# Patient Record
Sex: Female | Born: 1955 | Race: Black or African American | Hispanic: No | Marital: Single | Smoking: Current every day smoker
Health system: Southern US, Community
[De-identification: ages and names within clinical notes are randomized; demographics above are authoritative.]

## PROBLEM LIST (undated history)

## (undated) DIAGNOSIS — IMO0001 Reserved for inherently not codable concepts without codable children: Secondary | ICD-10-CM

## (undated) DIAGNOSIS — M1990 Unspecified osteoarthritis, unspecified site: Secondary | ICD-10-CM

## (undated) DIAGNOSIS — I10 Essential (primary) hypertension: Secondary | ICD-10-CM

## (undated) DIAGNOSIS — Z720 Tobacco use: Secondary | ICD-10-CM

## (undated) DIAGNOSIS — K219 Gastro-esophageal reflux disease without esophagitis: Secondary | ICD-10-CM

## (undated) DIAGNOSIS — T7840XA Allergy, unspecified, initial encounter: Secondary | ICD-10-CM

## (undated) DIAGNOSIS — E039 Hypothyroidism, unspecified: Secondary | ICD-10-CM

## (undated) HISTORY — DX: Reserved for inherently not codable concepts without codable children: IMO0001

## (undated) HISTORY — PX: TUBAL LIGATION: SHX77

## (undated) HISTORY — DX: Gastro-esophageal reflux disease without esophagitis: K21.9

## (undated) HISTORY — PX: OTHER SURGICAL HISTORY: SHX169

## (undated) HISTORY — DX: Hypothyroidism, unspecified: E03.9

## (undated) HISTORY — DX: Allergy, unspecified, initial encounter: T78.40XA

## (undated) HISTORY — DX: Tobacco use: Z72.0

---

## 1999-04-11 ENCOUNTER — Encounter: Admission: RE | Admit: 1999-04-11 | Discharge: 1999-04-11 | Payer: Self-pay | Admitting: Obstetrics & Gynecology

## 1999-04-11 ENCOUNTER — Other Ambulatory Visit: Admission: RE | Admit: 1999-04-11 | Discharge: 1999-04-11 | Payer: Self-pay | Admitting: Obstetrics & Gynecology

## 1999-11-24 ENCOUNTER — Other Ambulatory Visit: Admission: RE | Admit: 1999-11-24 | Discharge: 1999-11-24 | Payer: Self-pay | Admitting: Obstetrics and Gynecology

## 2001-08-03 ENCOUNTER — Ambulatory Visit (HOSPITAL_COMMUNITY): Admission: RE | Admit: 2001-08-03 | Discharge: 2001-08-03 | Payer: Self-pay | Admitting: Family Medicine

## 2001-08-03 ENCOUNTER — Encounter: Payer: Self-pay | Admitting: Family Medicine

## 2002-07-31 ENCOUNTER — Emergency Department (HOSPITAL_COMMUNITY): Admission: EM | Admit: 2002-07-31 | Discharge: 2002-07-31 | Payer: Self-pay

## 2002-07-31 ENCOUNTER — Encounter: Payer: Self-pay | Admitting: Internal Medicine

## 2002-07-31 ENCOUNTER — Ambulatory Visit (HOSPITAL_COMMUNITY): Admission: RE | Admit: 2002-07-31 | Discharge: 2002-07-31 | Payer: Self-pay | Admitting: Internal Medicine

## 2002-08-30 ENCOUNTER — Encounter: Payer: Self-pay | Admitting: Internal Medicine

## 2002-08-30 ENCOUNTER — Ambulatory Visit (HOSPITAL_COMMUNITY): Admission: RE | Admit: 2002-08-30 | Discharge: 2002-08-30 | Payer: Self-pay | Admitting: Internal Medicine

## 2003-10-02 ENCOUNTER — Emergency Department (HOSPITAL_COMMUNITY): Admission: EM | Admit: 2003-10-02 | Discharge: 2003-10-02 | Payer: Self-pay | Admitting: *Deleted

## 2003-11-09 ENCOUNTER — Ambulatory Visit (HOSPITAL_COMMUNITY): Admission: RE | Admit: 2003-11-09 | Discharge: 2003-11-09 | Payer: Self-pay | Admitting: General Surgery

## 2005-01-09 ENCOUNTER — Emergency Department (HOSPITAL_COMMUNITY): Admission: EM | Admit: 2005-01-09 | Discharge: 2005-01-10 | Payer: Self-pay | Admitting: Emergency Medicine

## 2005-05-16 ENCOUNTER — Emergency Department (HOSPITAL_COMMUNITY): Admission: EM | Admit: 2005-05-16 | Discharge: 2005-05-16 | Payer: Self-pay | Admitting: *Deleted

## 2006-03-16 HISTORY — PX: LACERATION REPAIR: SHX5168

## 2007-11-09 ENCOUNTER — Emergency Department (HOSPITAL_COMMUNITY): Admission: EM | Admit: 2007-11-09 | Discharge: 2007-11-09 | Payer: Self-pay | Admitting: Family Medicine

## 2008-06-05 ENCOUNTER — Emergency Department (HOSPITAL_COMMUNITY): Admission: EM | Admit: 2008-06-05 | Discharge: 2008-06-06 | Payer: Self-pay | Admitting: Emergency Medicine

## 2009-11-26 ENCOUNTER — Ambulatory Visit (HOSPITAL_COMMUNITY): Admission: RE | Admit: 2009-11-26 | Discharge: 2009-11-26 | Payer: Self-pay | Admitting: Family Medicine

## 2009-11-26 ENCOUNTER — Ambulatory Visit: Payer: Self-pay | Admitting: Internal Medicine

## 2009-12-04 ENCOUNTER — Encounter (INDEPENDENT_AMBULATORY_CARE_PROVIDER_SITE_OTHER): Payer: Self-pay | Admitting: Family Medicine

## 2009-12-04 LAB — CONVERTED CEMR LAB
Albumin: 4 g/dL (ref 3.5–5.2)
Alkaline Phosphatase: 69 units/L (ref 39–117)
BUN: 7 mg/dL (ref 6–23)
Basophils Absolute: 0.1 10*3/uL (ref 0.0–0.1)
Basophils Relative: 1 % (ref 0–1)
CO2: 28 meq/L (ref 19–32)
Calcium: 9.9 mg/dL (ref 8.4–10.5)
Creatinine, Ser: 0.71 mg/dL (ref 0.40–1.20)
Eosinophils Relative: 6 % — ABNORMAL HIGH (ref 0–5)
HCT: 35.8 % — ABNORMAL LOW (ref 36.0–46.0)
Hemoglobin: 12 g/dL (ref 12.0–15.0)
LDL Cholesterol: 96 mg/dL (ref 0–99)
MCHC: 33.5 g/dL (ref 30.0–36.0)
Monocytes Absolute: 0.5 10*3/uL (ref 0.1–1.0)
Monocytes Relative: 7 % (ref 3–12)
RDW: 15 % (ref 11.5–15.5)
Total Protein: 7.1 g/dL (ref 6.0–8.3)
Triglycerides: 57 mg/dL (ref <150)
Vit D, 25-Hydroxy: 42 ng/mL (ref 30–89)

## 2009-12-10 ENCOUNTER — Ambulatory Visit: Payer: Self-pay | Admitting: Family Medicine

## 2009-12-11 ENCOUNTER — Ambulatory Visit (HOSPITAL_COMMUNITY): Admission: RE | Admit: 2009-12-11 | Discharge: 2009-12-11 | Payer: Self-pay | Admitting: Family Medicine

## 2010-08-01 NOTE — Op Note (Signed)
NAME:  Debra [name], Debra [name]                           ACCOUNT NO.:  [id number]   MEDICAL RECORD NO.:  [id number]                   PATIENT TYPE:  AMB   LOCATION:  DAY                                  FACILITY:  [facility]   PHYSICIAN:  Ollen Gross. Vernell Morgans, M.D.              DATE OF BIRTH:  01-Jun-1955   DATE OF PROCEDURE:  11/09/2003  DATE OF DISCHARGE:  11/09/2003                                 OPERATIVE REPORT   PREOPERATIVE DIAGNOSIS:  Left femoral hernia.   POSTOPERATIVE DIAGNOSIS:  Left femoral hernia.   PROCEDURE:  Left femoral hernia repair with mesh.   SURGEON:  Ollen Gross. Carolynne Edouard, M.D.   ANESTHESIA:  General endotracheal.   PROCEDURE:  After informed consent was obtained, the patient was brought to  the operating room and placed in the supine position on the operating table.  After adequate induction of general anesthesia, the patient's left groin and  abdomen were prepped with Betadine and draped in the usual sterile manner.  A small incision was made from the edge of the pubic tubercle on the left  toward the anterior superior iliac spine for a distance of about 5 cm.  This  incision was carried down through the skin and subcutaneous tissue sharply  with the electrocautery.  A small bridging vein was encountered that was  clamped with hemostats, divided, and ligated with 3-0 silk ties.  Dissection  was then carried sharply until the fascia of the external oblique was  encountered.  The dissection was carried along the anterior edge and  inferior edge of the external oblique toward the ilioinguinal ligament.  The  femoral vessels were identified and just medial to these, there was a small  sac that was also identified.  This sac was dissected in a circumferential  manner by a combination of blunt and sharp dissection with the  electrocautery.  The sac would not reduce initially on its own.  The sac was  opened, and there were no visceral contents within the sac.  The sac was  then closed  again with a 2-0 silk suture ligature.  The opening in the  femoral canal beneath the inguinal ligament was then opened by a combination  of some blunt dissection and some sharp dissection of the ilioinguinal  ligament with the electrocautery.  This allowed the femoral hernia to be  reduced without difficulty.  Next a piece of plug-type mesh was used of a  medium size.  The plug-type mesh was grasped with a hemostat and placed into  the defect beneath the inguinal ligament, and the deep layer of the mesh was  then spread out to cover the defect.  The middle tails of the mesh were then  used to anchor the mesh in place.  Prolene 2-0 interrupted stitches were  used to close the space between the ilioinguinal ligament and the pectineal  ligament area, and the tails  of the mesh were incorporated into these  interrupted 2-0 Prolene closures to anchor the mesh in place.  Once this was  accomplished, the repair appeared to be in good position without any  tension.  The wound was then irrigated with copious amounts of saline.  The  subcutaneous fascia was then closed with a running 3-0 Vicryl stitch.  The  wound was infiltrated with 0.25% Marcaine and the wound was closed with a  running 4-0 Monocryl subcuticular stitch.  Benzoin and Steri-Strips and  sterile dressings were applied.  The patient tolerated the procedure well.  At the end of the case all needle, sponge, and instrument counts were  correct.  The patient was then awakened and taken to the recovery room in  stable condition.                                              Ollen Gross. Vernell Morgans, M.D.   PST/MEDQ  D:  11/14/2003  T:  11/14/2003  Job:  [id number]

## 2010-11-26 ENCOUNTER — Other Ambulatory Visit (HOSPITAL_COMMUNITY): Payer: Self-pay | Admitting: Family Medicine

## 2010-11-26 DIAGNOSIS — Z1231 Encounter for screening mammogram for malignant neoplasm of breast: Secondary | ICD-10-CM

## 2010-12-15 ENCOUNTER — Ambulatory Visit (HOSPITAL_COMMUNITY)
Admission: RE | Admit: 2010-12-15 | Discharge: 2010-12-15 | Disposition: A | Discharge status: Home / Self Care | Payer: Self-pay | Source: Ambulatory Visit | Attending: Family Medicine | Admitting: Family Medicine

## 2010-12-15 DIAGNOSIS — Z1231 Encounter for screening mammogram for malignant neoplasm of breast: Secondary | ICD-10-CM | POA: Insufficient documentation

## 2010-12-23 ENCOUNTER — Emergency Department (HOSPITAL_COMMUNITY): Payer: Self-pay

## 2010-12-23 ENCOUNTER — Emergency Department (HOSPITAL_COMMUNITY)
Admission: EM | Admit: 2010-12-23 | Discharge: 2010-12-23 | Disposition: A | Discharge status: Home / Self Care | Payer: Self-pay | Attending: Emergency Medicine | Admitting: Emergency Medicine

## 2010-12-23 DIAGNOSIS — R10816 Epigastric abdominal tenderness: Secondary | ICD-10-CM | POA: Insufficient documentation

## 2010-12-23 DIAGNOSIS — K5900 Constipation, unspecified: Secondary | ICD-10-CM | POA: Insufficient documentation

## 2010-12-23 DIAGNOSIS — I10 Essential (primary) hypertension: Secondary | ICD-10-CM | POA: Insufficient documentation

## 2010-12-23 DIAGNOSIS — R079 Chest pain, unspecified: Secondary | ICD-10-CM | POA: Insufficient documentation

## 2010-12-23 DIAGNOSIS — F17200 Nicotine dependence, unspecified, uncomplicated: Secondary | ICD-10-CM | POA: Insufficient documentation

## 2010-12-23 DIAGNOSIS — R1012 Left upper quadrant pain: Secondary | ICD-10-CM | POA: Insufficient documentation

## 2010-12-23 LAB — COMPREHENSIVE METABOLIC PANEL
ALT: 18 U/L (ref 0–35)
Alkaline Phosphatase: 91 U/L (ref 39–117)
CO2: 27 mEq/L (ref 19–32)
GFR calc Af Amer: >90 mL/min (ref >90)
GFR calc non Af Amer: >90 mL/min (ref >90)
Glucose, Bld: 97 mg/dL (ref 70–99)
Potassium: 3.9 mEq/L (ref 3.5–5.1)
Sodium: 140 mEq/L (ref 135–145)

## 2010-12-23 LAB — URINALYSIS, ROUTINE W REFLEX MICROSCOPIC
Bilirubin Urine: NEGATIVE
Protein, ur: NEGATIVE mg/dL
Specific Gravity, Urine: 1.01 (ref 1.005–1.030)
Urobilinogen, UA: 0.2 mg/dL (ref 0.0–1.0)

## 2010-12-23 LAB — URINE MICROSCOPIC-ADD ON

## 2010-12-23 LAB — DIFFERENTIAL
Basophils Absolute: 0 10*3/uL (ref 0.0–0.1)
Basophils Relative: 1 % (ref 0–1)
Monocytes Relative: 7 % (ref 3–12)
Neutro Abs: 4.7 10*3/uL (ref 1.7–7.7)
Neutrophils Relative %: 59 % (ref 43–77)

## 2010-12-23 LAB — CBC
Hemoglobin: 11.5 g/dL — ABNORMAL LOW (ref 12.0–15.0)
RBC: 3.39 MIL/uL — ABNORMAL LOW (ref 3.87–5.11)

## 2012-07-04 ENCOUNTER — Ambulatory Visit (INDEPENDENT_AMBULATORY_CARE_PROVIDER_SITE_OTHER): Payer: BC Managed Care – PPO | Admitting: Family Medicine

## 2012-07-04 ENCOUNTER — Encounter: Payer: Self-pay | Admitting: *Deleted

## 2012-07-04 ENCOUNTER — Encounter: Payer: Self-pay | Admitting: Family Medicine

## 2012-07-04 VITALS — BP 126/88 | Ht 60.5 in | Wt 176.0 lb

## 2012-07-04 DIAGNOSIS — Z87448 Personal history of other diseases of urinary system: Secondary | ICD-10-CM

## 2012-07-04 DIAGNOSIS — S76019A Strain of muscle, fascia and tendon of unspecified hip, initial encounter: Secondary | ICD-10-CM | POA: Insufficient documentation

## 2012-07-04 DIAGNOSIS — S76012A Strain of muscle, fascia and tendon of left hip, initial encounter: Secondary | ICD-10-CM

## 2012-07-04 DIAGNOSIS — IMO0002 Reserved for concepts with insufficient information to code with codable children: Secondary | ICD-10-CM

## 2012-07-04 DIAGNOSIS — Z7189 Other specified counseling: Secondary | ICD-10-CM | POA: Insufficient documentation

## 2012-07-04 DIAGNOSIS — Z6833 Body mass index (BMI) 33.0-33.9, adult: Secondary | ICD-10-CM

## 2012-07-04 LAB — COMPREHENSIVE METABOLIC PANEL
AST: 24 U/L (ref 0–37)
BUN: 13 mg/dL (ref 6–23)
Calcium: 9.6 mg/dL (ref 8.4–10.5)
Chloride: 104 mEq/L (ref 96–112)
Creat: 0.81 mg/dL (ref 0.50–1.10)
Glucose, Bld: 87 mg/dL (ref 70–99)

## 2012-07-04 LAB — POCT URINALYSIS DIPSTICK
Bilirubin, UA: NEGATIVE
Glucose, UA: NEGATIVE
Leukocytes, UA: NEGATIVE
Nitrite, UA: NEGATIVE

## 2012-07-04 LAB — LIPID PANEL
Cholesterol: 210 mg/dL — ABNORMAL HIGH (ref 0–200)
Triglycerides: 94 mg/dL (ref <150)
VLDL: 19 mg/dL (ref 0–40)

## 2012-07-04 LAB — POCT UA - MICROSCOPIC ONLY

## 2012-07-04 NOTE — Addendum Note (Signed)
Addended by: [place], Adelheid Hoggard on: 07/04/2012 03:04 PM   Modules accepted: Orders

## 2012-07-04 NOTE — Assessment & Plan Note (Signed)
Counseled on quitting and will attempt.

## 2012-07-04 NOTE — Patient Instructions (Addendum)
Thank you for coming in today, and thank you for taking such good care of Debra [name] I think you have a hip flexor strain. Please do the exercises in your handout at least 1-2 times daily for the next 4 weeks Do your exercises as 3 sets of 15.  Please make sure to have a mammogram performed soon I will let you know if anything comes back positive on your urine sample today. Great job on cutting your smoking down to so few per day. With a little more effort I'm sure you can quit completely.

## 2012-07-04 NOTE — Assessment & Plan Note (Signed)
Several lb wt gain over past several years. Pt concerned about this. No known h/o of cholesterol testing or of family HLD. Fasting lipid today CMET today

## 2012-07-04 NOTE — Assessment & Plan Note (Signed)
Concern that this was never fully worked up. No gross hematuria. Will obtain UA today

## 2012-07-04 NOTE — Assessment & Plan Note (Signed)
Hip flexor strain vs hip arthritis vs hernia. Very low suspicioun for symptomatic arthritis or hernia Exercises given and demonstrated NSAIDs F/u in 4 wks

## 2012-07-04 NOTE — Progress Notes (Signed)
Debra [name] is a 57 y.o. female who presents to [facility] today for groin pain  L Groin pain: started 2.5 mo ago. Sudden onset while lifting pt. Intermittent. Comes on when flexing hip. Prevents from movement. Denies any loss of sensation ro strength in LE. Tylenol and pain medications from friend. Able to walk w/o pain.   Tobacco: trying to quit. Only smoking 3 per day. Slowly stopping. Now more ready to quit.   H/o hematuria. [facility]. Non painful. No gross hematuria. Abnormal urine noted >25yr ago. No night sweats, wt loss. Post menopausal.   The following portions of the patient's history were reviewed and updated as appropriate: allergies, current medications, past medical history, family and social history, and problem list.  Patient is a smoker  No past medical history on file.  ROS as above otherwise neg.    Medications reviewed. No current outpatient prescriptions on file.   No current facility-administered medications for this visit.    Exam: BP 126/88  Ht 5' 0.5" (1.537 m)  Wt 176 lb (79.833 kg)  BMI 33.79 kg/m2 Gen: Well NAD HEENT: EOMI,  MMM Lungs: CTABL Nl WOB Heart: RRR no MRG Abd: NT, ND, obese MSK: FROM passively, tender to deep palpation for L medial hip joint. Straight leg raise, FABERs, internal/external rotation nml. Leg length nml.  Exts: Non edematous BL  LE, warm and well perfused.   No results found for this or any previous visit (from the past 72 hour(s)).

## 2012-07-08 ENCOUNTER — Encounter: Payer: Self-pay | Admitting: Family Medicine

## 2012-07-08 DIAGNOSIS — E7800 Pure hypercholesterolemia, unspecified: Secondary | ICD-10-CM | POA: Insufficient documentation

## 2012-07-19 ENCOUNTER — Telehealth: Payer: Self-pay | Admitting: Family Medicine

## 2012-07-19 NOTE — Telephone Encounter (Signed)
Will forward to MD as Lipid panel is slightly abnormal. Greysen Devino, Maryjo Rochester

## 2012-07-19 NOTE — Telephone Encounter (Signed)
Patient is calling for results of her urine test and Dr. Konrad Dolores told her that if she needed something to help her sleep to let him know and she would like something sent to [facility] @ [address].

## 2012-07-20 NOTE — Telephone Encounter (Signed)
Called and spoke to pt by phone regarding results.  Meds ordered Nothing to do

## 2012-08-03 ENCOUNTER — Telehealth: Payer: Self-pay | Admitting: Family Medicine

## 2012-08-03 DIAGNOSIS — G479 Sleep disorder, unspecified: Secondary | ICD-10-CM

## 2012-08-03 DIAGNOSIS — G4700 Insomnia, unspecified: Secondary | ICD-10-CM | POA: Insufficient documentation

## 2012-08-03 NOTE — Telephone Encounter (Signed)
Spoke to pt by phone regarding hip pain and sleep. Pt was told at last appt that we would address this at her f/u as we did not have time during her initial evaluation.   Sleep problems for past year Tries to go to sleep at 11pm nightly Taking friends clonidine PRN Waking up up at 3am Falls asleep aroun 6:30 adn up by 7-730pm  Decaf coffee No caffinated soda or medications such as goody's Drinking water Has tried several otc sleep aids but states they make her feel odd. Has not tried melatonin.   Is doing exercises for hip pain

## 2012-08-03 NOTE — Telephone Encounter (Signed)
Patient is calling back, a little upset because she hasn't heard anything back about her results or the medication and she isn't feeling any better at all.  She was especially upset after hearing that there was a message that Dr. Konrad Dolores had spoken to her about her results and that there were meds ordered.  She said she received no such phone call and no medications and there aren't any meds in her med list.  She said she isn't very impressed, she has only been here for 1 visit.  She would like this clarified and corrected today.  She has to leave for class at 3:30 so she wants to speak to someone before then.

## 2012-08-03 NOTE — Telephone Encounter (Addendum)
Patient states a sleep med was supposed to be Rx'd.  Phone note on 07/19/12 states meds ordered per Dr. Konrad Dolores.  No med Rx'd in Epic.  Will check with Dr. Konrad Dolores and call patient back.  Per patient's request--Ok to leave message on her voice mail if no answer. Gaylene Brooks, RN

## 2012-08-03 NOTE — Assessment & Plan Note (Signed)
Sleep problems for past year Tries to go to sleep at 11pm nightly Taking friends clonidine PRN Waking up up at 3am Falls asleep aroun 6:30 adn up by 7-730pm  Decaf coffee No caffinated soda or medications such as goody's Drinking water Has tried several otc sleep aids but states they make her feel odd. Has not tried melatonin.   - Discussed sleep hygeine and melatonin.  - Not willing to Rx sleep aids such as ambien at this time - No psychiatric hx

## 2012-08-03 NOTE — Telephone Encounter (Signed)
Patient is calling back, a little upset because she hasn't heard anything back about her results or the medication and she isn't feeling any better at all

## 2012-08-03 NOTE — Telephone Encounter (Signed)
Called and left a voicemail w/ pt to call after hours line as I will be on call all night tonight. Need to speak to pt prior to Rx

## 2012-09-30 ENCOUNTER — Encounter: Payer: Self-pay | Admitting: Family Medicine

## 2012-09-30 ENCOUNTER — Ambulatory Visit (INDEPENDENT_AMBULATORY_CARE_PROVIDER_SITE_OTHER): Payer: BC Managed Care – PPO | Admitting: Family Medicine

## 2012-09-30 VITALS — BP 121/78 | HR 80 | Temp 98.1°F | Ht 60.5 in | Wt 172.0 lb

## 2012-09-30 DIAGNOSIS — Z5189 Encounter for other specified aftercare: Secondary | ICD-10-CM

## 2012-09-30 DIAGNOSIS — G4700 Insomnia, unspecified: Secondary | ICD-10-CM

## 2012-09-30 DIAGNOSIS — S76012D Strain of muscle, fascia and tendon of left hip, subsequent encounter: Secondary | ICD-10-CM

## 2012-09-30 DIAGNOSIS — G479 Sleep disorder, unspecified: Secondary | ICD-10-CM

## 2012-09-30 MED ORDER — ZOLPIDEM TARTRATE 5 MG PO TABS: 5.0000 mg | ORAL_TABLET | Freq: Every evening | ORAL | Status: DC | PRN

## 2012-09-30 NOTE — Patient Instructions (Addendum)
Thank you for coming in today You are doing relatively well Please continue taking Ibuprofen 600mg  as needed for pain Continue doing your exercises I will send in a referral for you to be seen by the Sports Medicine doctors Please start taking the Ambien only as needed Stop taking the clonidine Remember to improve your Sleep Hygiene.  Have a blessed day.

## 2012-09-30 NOTE — Progress Notes (Signed)
Debra [name] is a 57 y.o. female who presents to [facility] today for hip pain  Hip pain: doing exercises and NSAIDs as discussed last time w/o benefit. Located in L groin w/o radiation. Pain sharp and primarily w/ hip flexion. Present for 5.5 months. Intermittent. Walks w/o any issues. Unable to stand on one leg and lift the affected leg  Tobacco: 4 a day. Needs the stress relief.   Insomnia: difficulty getting to sleep at night.  melatonin w/o benefit, gave a flutter feeling. Goes to school from 6-9pm Mo -thu. Homework 11-12am. Takes clonidine (from friend) and turns on TV until falls asleep. Wakes up at 4am. Not associated w/ needing to urinate. Takes a nap many afternoons often times for more than 2 hours. Only caffeine is 1 cup coffee in the morning. Has been a problem for years for the pt.    The following portions of the patient's history were reviewed and updated as appropriate: allergies, current medications, past medical history, family and social history, and problem list.  Patient is a smoker  Past Medical History  Diagnosis Date  . Reflux     ROS as above otherwise neg.    Medications reviewed. No current outpatient prescriptions on file.   No current facility-administered medications for this visit.    Exam BP 121/78  Pulse 80  Temp(Src) 98.1 F (36.7 C) (Oral)  Ht 5' 0.5" (1.537 m)  Wt 172 lb (78.019 kg)  BMI 33.03 kg/m2 Gen: Well NAD HEENT: EOMI,  MMM Lungs: CTABL Nl WOB Heart: RRR no MRG Abd: NABS, NT, ND Exts: Non edematous BL  LE, warm and well perfused.  MSK: FROM, L leg TTP along soft tissue of medial thigh, no abnormalities noted on palpation. No pain in hip joint. Pain in medial thigh on flexion of hip both lying down and sitting. No pain on extension, abduction, or adduction of hip/leg. FABERs, internal rotation external rotation of hip nml, Sensation intact,   No results found for this or any previous visit (from the past 72 hour(s)).

## 2012-09-30 NOTE — Assessment & Plan Note (Signed)
Performing exercises and NSAIDs w/o relief Unsure of etiology.  Pt complaining of soft tissue medial thigh pain w/ hip flexion. No actual hip joint pain. Would favor [place] through [facility] team vs other treatment modality as pt w/o relief and no clear etiology Refer to [facility]

## 2012-10-01 NOTE — Assessment & Plan Note (Signed)
Did no tolerate melatonin Spent extensive time discussing sleep hygiene. Ambien PRN. Discussed risks benefits Stop taking friends clonidine WIll not escalate Ambien dose until trying trazodone or hydralazine PRN Would also like pt to attempt CBT Pt not using ETOH for sleep

## 2012-10-12 ENCOUNTER — Ambulatory Visit (INDEPENDENT_AMBULATORY_CARE_PROVIDER_SITE_OTHER): Payer: BC Managed Care – PPO | Admitting: Emergency Medicine

## 2012-10-12 VITALS — BP 144/88 | Ht 64.0 in | Wt 155.0 lb

## 2012-10-12 DIAGNOSIS — M76899 Other specified enthesopathies of unspecified lower limb, excluding foot: Secondary | ICD-10-CM | POA: Insufficient documentation

## 2012-10-12 DIAGNOSIS — M25559 Pain in unspecified hip: Secondary | ICD-10-CM

## 2012-10-12 DIAGNOSIS — M25552 Pain in left hip: Secondary | ICD-10-CM

## 2012-10-12 MED ORDER — MELOXICAM 15 MG PO TABS: 15.0000 mg | ORAL_TABLET | Freq: Every day | ORAL | Status: DC

## 2012-10-12 NOTE — Progress Notes (Signed)
  Subjective    Patient ID: Debra [name], female    DOB: Jul 16, 1955, 57 y.o.   MRN: [id number]  HPI Debra [name] is a 57 year old woman who presents to the sports medicine clinic office today with a complaint of left hip pain she reports the onset of symptoms 6 months to one year ago. pain is worse when reaching down to put on socks in the morning or reaching down to tie her shoes. She reports the pain with lifting her leg up on occasion as well. She describes the pain as originating in her groin and radiating down the medial aspect of her thigh supramid thigh. She got mild relief with Motrin but has since stopped the taking it due to the size of dose needed to achieve comfort her pain relief. She rates the pain at worst a 6/10.  She denies any significant injury and does not recall a specific instance that which the pain started. 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  Past Medical History  Diagnosis Date  . Reflux    Past Surgical History  Procedure Laterality Date  . Tubal ligation Bilateral   . Laceration repair      severe laceration requiring OR repair and PT     Review of Systems  review of systems as as per history of present illness, otherwise negative.   Objective:   Physical Exam  this is a 57 year old woman in no acute distress alert and oriented  Hip: ROM IR: 45 Deg, ER: 45 Deg, Flexion: 120 Deg, Extension: 30 Abduction: 45 Deg, Adduction: 45 Deg Strength IR: 5/5, ER: 5/5, Flexion: 5/5, Extension: 5/5, Abduction: 5/5, Adduction: 5/5 Pelvic alignment unremarkable to inspection and palpation. Standing hip rotation and gait without trendelenburg / unsteadiness. Greater trochanter without tenderness to palpation. No tenderness over piriformis and greater trochanter. No SI joint tenderness and normal minimal SI movement. Negative log roll. She does report symptoms when  combining flexion with external rotation.  Neurovascularly intact.  No antalgic gait.

## 2012-10-12 NOTE — Patient Instructions (Signed)
Hip Pain The hips join the upper legs to the lower pelvis. The bones, cartilage, tendons, and muscles of the hip joint perform a lot of work each day holding your body weight and allowing you to move around. Hip pain is a common symptom. It can range from a minor ache to severe pain on 1 or both hips. Pain may be felt on the inside of the hip joint near the groin, or the outside near the buttocks and upper thigh. There may be swelling or stiffness as well. It occurs more often when a person walks or performs activity. There are many reasons hip pain can develop. CAUSES  It is important to work with your caregiver to identify the cause since many conditions can impact the bones, cartilage, muscles, and tendons of the hips. Causes for hip pain include:  Broken (fractured) bones.  Separation of the thighbone from the hip socket (dislocation).  Torn cartilage of the hip joint.  Swelling (inflammation) of a tendon (tendonitis), the sac within the hip joint (bursitis), or a joint.  A weakening in the abdominal wall (hernia), affecting the nerves to the hip.  Arthritis in the hip joint or lining of the hip joint.  Pinched nerves in the back, hip, or upper thigh.  A bulging disc in the spine (herniated disc).  Rarely, bone infection or cancer. DIAGNOSIS  The location of your hip pain will help your caregiver understand what may be causing the pain. A diagnosis is based on your medical history, your symptoms, results from your physical exam, and results from diagnostic tests. Diagnostic tests may include X-ray exams, a computerized magnetic scan (magnetic resonance imaging, MRI), or bone scan. TREATMENT  Treatment will depend on the cause of your hip pain. Treatment may include:  Limiting activities and resting until symptoms improve.  Crutches or other walking supports (a cane or brace).  Ice, elevation, and compression.  Physical therapy or home exercises.  Shoe inserts or special  shoes.  Losing weight.  Medications to reduce pain.  Undergoing surgery. HOME CARE INSTRUCTIONS   Only take over-the-counter or prescription medicines for pain, discomfort, or fever as directed by your caregiver.  Put ice on the injured area:  Put ice in a plastic bag.  Place a towel between your skin and the bag.  Leave the ice on for 15-20 minutes at a time, 3-4 times a day.  Keep your leg raised (elevated) when possible to lessen swelling.  Avoid activities that cause pain.  Follow specific exercises as directed by your caregiver.  Sleep with a pillow between your legs on your most comfortable side.  Record how often you have hip pain, the location of the pain, and what it feels like. This information may be helpful to you and your caregiver.  Ask your caregiver about returning to work or sports and whether you should drive.  Follow up with your caregiver for further exams, therapy, or testing as directed. SEEK MEDICAL CARE IF:   Your pain or swelling continues or worsens after 1 week.  You are feeling unwell or have chills.  You have increasing difficulty with walking.  You have a loss of sensation or other new symptoms.  You have questions or concerns. SEEK IMMEDIATE MEDICAL CARE IF:   You cannot put weight on the affected hip.  You have fallen.  You have a sudden increase in pain and swelling in your hip.  You have a fever. MAKE SURE YOU:   Understand these instructions.  Will watch your condition.  Will get help right away if you are not doing well or get worse. Document Released: 08/20/2009 Document Revised: 05/25/2011 Document Reviewed: 08/20/2009 [facility] Patient Information 2014 [place], [place].  Follow up after your xrays are performed next week.

## 2012-10-14 ENCOUNTER — Other Ambulatory Visit: Payer: Self-pay | Admitting: Emergency Medicine

## 2012-10-14 ENCOUNTER — Ambulatory Visit (HOSPITAL_COMMUNITY)
Admission: RE | Admit: 2012-10-14 | Discharge: 2012-10-14 | Disposition: A | Discharge status: Home / Self Care | Payer: BC Managed Care – PPO | Source: Ambulatory Visit | Attending: Emergency Medicine | Admitting: Emergency Medicine

## 2012-10-14 DIAGNOSIS — M76899 Other specified enthesopathies of unspecified lower limb, excluding foot: Secondary | ICD-10-CM | POA: Insufficient documentation

## 2012-10-14 DIAGNOSIS — M25552 Pain in left hip: Secondary | ICD-10-CM

## 2012-10-14 DIAGNOSIS — M25559 Pain in unspecified hip: Secondary | ICD-10-CM | POA: Insufficient documentation

## 2012-10-21 ENCOUNTER — Encounter: Payer: Self-pay | Admitting: Sports Medicine

## 2012-10-21 ENCOUNTER — Ambulatory Visit (INDEPENDENT_AMBULATORY_CARE_PROVIDER_SITE_OTHER): Payer: BC Managed Care – PPO | Admitting: Sports Medicine

## 2012-10-21 VITALS — BP 112/77 | HR 86 | Ht 64.0 in | Wt 155.0 lb

## 2012-10-21 DIAGNOSIS — M25559 Pain in unspecified hip: Secondary | ICD-10-CM

## 2012-10-21 DIAGNOSIS — M25552 Pain in left hip: Secondary | ICD-10-CM

## 2012-10-21 NOTE — Progress Notes (Signed)
  Subjective:    Patient ID: Debra [name], female    DOB: 02-03-1956, 57 y.o.   MRN: [id number]  HPI Is a 57 year old female who presents to the sports medicine clinic for followup after her x-rays were done for evaluation of left hip pain. She has a history of falls one year of intermittent episodes of left hip pain the pain is medial in the proximal adductor region of her thigh.  Since her last visit she has been taking meloxicam with mild relief of her symptoms. She has no new complaints today   Review of Systems Review of systems as per history of present illness otherwise negative    Objective:   Physical Exam BP 112/77  Pulse 86  Ht 5\' 4"  (1.626 m)  Wt 155 lb (70.308 kg)  BMI 26.59 kg/m2 This is a well-appearing pleasant 57 year old female awake alert in no acute distress  Hip: ROM IR: 30  Deg, ER: 30 Deg, Flexion: 120 Deg, Extension: 100 Deg, Abduction: 45 Deg, Adduction: 30 Deg Strength IR: 5/5, ER: 5/5, Flexion: 5/5, Extension: 5/5, Abduction: 5/5, Adduction: 5/5 Pelvic alignment unremarkable to inspection and palpation. Standing hip rotation and gait without trendelenburg / unsteadiness. Greater trochanter without tenderness to palpation. No tenderness over piriformis and greater trochanter. No SI joint tenderness and normal minimal SI movement. Positive FABER  X-rays of her left hip are reviewed and are normal with no evidence of osteoarthritis or degenerative joint disease

## 2012-10-21 NOTE — Assessment & Plan Note (Signed)
X-ray results were reviewed with the patient today. She is pleased with the negative results and has been taking the Mobic as needed. History and findings consistent with adductor muscle strain versus tendinitis. Patient declined physical therapy. She will continue when necessary NSAIDs Hip strengthening exercises were given to her to perform at home. 2 we'll followup as needed

## 2013-04-19 ENCOUNTER — Encounter: Payer: Self-pay | Admitting: Family Medicine

## 2013-04-19 ENCOUNTER — Ambulatory Visit (INDEPENDENT_AMBULATORY_CARE_PROVIDER_SITE_OTHER): Payer: BC Managed Care – PPO | Admitting: Family Medicine

## 2013-04-19 VITALS — BP 148/85 | HR 64 | Temp 98.2°F | Ht 64.0 in | Wt 173.0 lb

## 2013-04-19 DIAGNOSIS — R142 Eructation: Secondary | ICD-10-CM

## 2013-04-19 DIAGNOSIS — R143 Flatulence: Secondary | ICD-10-CM

## 2013-04-19 DIAGNOSIS — R141 Gas pain: Secondary | ICD-10-CM

## 2013-04-19 MED ORDER — SENNA 8.6 MG PO TABS: 2.0000 | ORAL_TABLET | Freq: Two times a day (BID) | ORAL | Status: DC

## 2013-04-19 MED ORDER — GLYCERIN (LAXATIVE) 2 G RE SUPP: 1.0000 | Freq: Once | RECTAL | Status: DC

## 2013-04-19 NOTE — Progress Notes (Signed)
Debra [name] is a 58 y.o. female who [facility]  Flatulance: increased since November. No change. Breaking wind about 3x daily. Pt main concern is that she is unable to control the flatulance and will break wind in public. Denies any medication changes, fevers, dysuria, abd pain. Takes stool softeners and has 2-3x wkly. Increase in sugar, coffee, and tea intake during this time. Very little fried food on an wkly basis. No loss of bowel or bladder function  The following portions of the patient's history were reviewed and updated as appropriate: allergies, current medications, past medical history, family and social history, and problem list.   Past Medical History  Diagnosis Date  . Reflux     ROS as above otherwise neg.    Medications reviewed. Current Outpatient Prescriptions  Medication Sig Dispense Refill  . glycerin adult 2 G SUPP Place 1 suppository rectally once.  10 each  prn  . meloxicam (MOBIC) 15 MG tablet Take 1 tablet (15 mg total) by mouth daily.  30 tablet  1  . senna (SENOKOT) 8.6 MG TABS tablet Take 2 tablets (17.2 mg total) by mouth 2 (two) times daily.  30 each  prn  . zolpidem (AMBIEN) 5 MG tablet Take 1 tablet (5 mg total) by mouth at bedtime as needed for sleep.  30 tablet  3   No current facility-administered medications for this visit.    Exam:  BP 148/85  Pulse 64  Temp(Src) 98.2 F (36.8 C) (Oral)  Ht 5\' 4"  (1.626 m)  Wt 173 lb (78.472 kg)  BMI 29.68 kg/m2 Gen: Well NAD HEENT: EOMI,  MMM Lungs: CTABL Nl WOB Heart: RRR no MRG Abd: NABS, NT, ND Exts: Non edematous BL  LE, warm and well perfused.   No results found for this or any previous visit (from the past 72 hour(s)).  A/P (as seen in Problem list)  Flatulence Likely from constipation, and diet, w/ some possible sphincter laxity though not to the point of causing fecal incontinence. Increase to max dose of Gas-X Start Miralax BID adn Senna BID Dietary changes  reviewed Recommended Kegal exercises

## 2013-04-19 NOTE — Assessment & Plan Note (Addendum)
Likely from constipation, and diet, w/ some possible sphincter laxity though not to the point of causing fecal incontinence. Increase to max dose of Gas-X Start Miralax BID adn Senna BID Dietary changes reviewed Recommended Kegal exercises Also consider starting a probiotic

## 2013-04-19 NOTE — Patient Instructions (Signed)
THank you for coming in today You are doing well overall but I think the majority of your symptoms are from constipation and a weak anal sphincter Please start miralax (2 packs twice a day), and Senokot 2 tabs twice a day and the glycerine suppositories as needed until you have daily soft bowel movements Please increase the amount of fiber adn fluid in your diet Please start performing Kegel exercises Come back if this does not help

## 2013-08-10 ENCOUNTER — Other Ambulatory Visit (HOSPITAL_COMMUNITY)
Admission: RE | Admit: 2013-08-10 | Discharge: 2013-08-10 | Disposition: A | Discharge status: Home / Self Care | Payer: No Typology Code available for payment source | Source: Ambulatory Visit | Attending: Family Medicine | Admitting: Family Medicine

## 2013-08-10 ENCOUNTER — Encounter: Payer: Self-pay | Admitting: Family Medicine

## 2013-08-10 ENCOUNTER — Ambulatory Visit (INDEPENDENT_AMBULATORY_CARE_PROVIDER_SITE_OTHER): Payer: No Typology Code available for payment source | Admitting: Family Medicine

## 2013-08-10 VITALS — BP 118/72 | HR 76 | Temp 98.1°F | Wt 163.0 lb

## 2013-08-10 DIAGNOSIS — G4700 Insomnia, unspecified: Secondary | ICD-10-CM

## 2013-08-10 DIAGNOSIS — F32A Depression, unspecified: Secondary | ICD-10-CM | POA: Insufficient documentation

## 2013-08-10 DIAGNOSIS — Z1151 Encounter for screening for human papillomavirus (HPV): Secondary | ICD-10-CM | POA: Insufficient documentation

## 2013-08-10 DIAGNOSIS — F329 Major depressive disorder, single episode, unspecified: Secondary | ICD-10-CM

## 2013-08-10 DIAGNOSIS — E7800 Pure hypercholesterolemia, unspecified: Secondary | ICD-10-CM

## 2013-08-10 DIAGNOSIS — Z0000 Encounter for general adult medical examination without abnormal findings: Secondary | ICD-10-CM

## 2013-08-10 DIAGNOSIS — F3289 Other specified depressive episodes: Secondary | ICD-10-CM

## 2013-08-10 DIAGNOSIS — R141 Gas pain: Secondary | ICD-10-CM

## 2013-08-10 DIAGNOSIS — R143 Flatulence: Secondary | ICD-10-CM

## 2013-08-10 DIAGNOSIS — Z01419 Encounter for gynecological examination (general) (routine) without abnormal findings: Secondary | ICD-10-CM | POA: Insufficient documentation

## 2013-08-10 DIAGNOSIS — R142 Eructation: Secondary | ICD-10-CM

## 2013-08-10 DIAGNOSIS — Z124 Encounter for screening for malignant neoplasm of cervix: Secondary | ICD-10-CM

## 2013-08-10 LAB — COMPREHENSIVE METABOLIC PANEL
ALBUMIN: 4.4 g/dL (ref 3.5–5.2)
ALK PHOS: 91 U/L (ref 39–117)
ALT: 25 U/L (ref 0–35)
AST: 22 U/L (ref 0–37)
BUN: 13 mg/dL (ref 6–23)
CALCIUM: 9.3 mg/dL (ref 8.4–10.5)
CHLORIDE: 105 meq/L (ref 96–112)
CO2: 27 meq/L (ref 19–32)
Creat: 0.76 mg/dL (ref 0.50–1.10)
GLUCOSE: 96 mg/dL (ref 70–99)
POTASSIUM: 4.1 meq/L (ref 3.5–5.3)
SODIUM: 139 meq/L (ref 135–145)
TOTAL PROTEIN: 7.6 g/dL (ref 6.0–8.3)
Total Bilirubin: 0.4 mg/dL (ref 0.2–1.2)

## 2013-08-10 LAB — LIPID PANEL
CHOLESTEROL: 200 mg/dL (ref 0–200)
HDL: 46 mg/dL (ref >39)
LDL Cholesterol: 139 mg/dL — ABNORMAL HIGH (ref 0–99)
Total CHOL/HDL Ratio: 4.3 Ratio
Triglycerides: 75 mg/dL (ref <150)
VLDL: 15 mg/dL (ref 0–40)

## 2013-08-10 LAB — CBC
HCT: 34.9 % — ABNORMAL LOW (ref 36.0–46.0)
Hemoglobin: 12.1 g/dL (ref 12.0–15.0)
MCH: 32.5 pg (ref 26.0–34.0)
MCHC: 34.7 g/dL (ref 30.0–36.0)
MCV: 93.8 fL (ref 78.0–100.0)
PLATELETS: 358 10*3/uL (ref 150–400)
RBC: 3.72 MIL/uL — AB (ref 3.87–5.11)
RDW: 14.6 % (ref 11.5–15.5)
WBC: 8.1 10*3/uL (ref 4.0–10.5)

## 2013-08-10 LAB — TSH: TSH: 5.587 u[IU]/mL — AB (ref 0.350–4.500)

## 2013-08-10 LAB — VITAMIN B12

## 2013-08-10 LAB — POCT GLYCOSYLATED HEMOGLOBIN (HGB A1C): HEMOGLOBIN A1C: 5.6

## 2013-08-10 MED ORDER — TRAZODONE HCL 50 MG PO TABS: 25.0000 mg | ORAL_TABLET | Freq: Every evening | ORAL | Status: DC | PRN

## 2013-08-10 NOTE — Progress Notes (Signed)
Debra [name] is a 58 y.o. female who presents to [facility] today for CPE  Pap today  Sleep: continues to have sleeping difficulty gettign to sleep or waking at night. Denies any vivid dreams   Depression: ongoing for 2 years gradual worsening. No close family or friends for support. Church helps but only goes once a week. Denies any HI/SI, eating disturbance, difficulty enjoying daily activities. No one particular event that worsened symptoms. Not interested in using medications but interested in counseling.    The following portions of the patient's history were reviewed and updated as appropriate: allergies, current medications, past medical history, family and social history, and problem list.  Patient is a nonsmoker.  Health Maintenance: PAP today, scheduling colonoscopy  Past Medical History  Diagnosis Date  . Reflux     ROS as above otherwise neg.    Medications reviewed. Current Outpatient Prescriptions  Medication Sig Dispense Refill  . glycerin adult 2 G SUPP Place 1 suppository rectally once.  10 each  prn  . meloxicam (MOBIC) 15 MG tablet Take 1 tablet (15 mg total) by mouth daily.  30 tablet  1  . senna (SENOKOT) 8.6 MG TABS tablet Take 2 tablets (17.2 mg total) by mouth 2 (two) times daily.  30 each  prn  . zolpidem (AMBIEN) 5 MG tablet Take 1 tablet (5 mg total) by mouth at bedtime as needed for sleep.  30 tablet  3   No current facility-administered medications for this visit.    Exam: BP 118/72  Pulse 76  Temp(Src) 98.1 F (36.7 C)  Wt 163 lb (73.936 kg) Gen: Well NAD HEENT: EOMI,  MMM Lungs: CTABL Nl WOB Heart: RRR no MRG Abd: NABS, NT, ND GU: vaginal walls well rugated. Cervix nml. No lesions or tenderness Exts: Non edematous BL  LE, warm and well perfused.   No results found for this or any previous visit (from the past 72 hour(s)).  A/P (as seen in Problem list)  No problem-specific assessment & plan notes found for this encounter.   Spent >25 min in  direct pt care and counseling

## 2013-08-10 NOTE — Assessment & Plan Note (Signed)
Resolved w/ cutting back on processed sugar adn bowel regimen. No longer taking bowel prep

## 2013-08-10 NOTE — Assessment & Plan Note (Signed)
Discussed sleep hygeine Start trazodone

## 2013-08-10 NOTE — Assessment & Plan Note (Signed)
Pt to call for colonoscopy Start ASA CMET, Lipids, CBC,  A1c

## 2013-08-10 NOTE — Assessment & Plan Note (Signed)
PHQ9: 13 Pt would like to start counseling TSH today, VIT D, B12, Pt to go to either [facility] or [facility] for counseling

## 2013-08-10 NOTE — Patient Instructions (Addendum)
You are doing well overall Please start the trazodone for the sleep adn remember sleep hygeine Please follow up with gettign yourself scheduled for your colonoscopy Please start a daliy baby aspirin Please come back in 4 weeks Please call and set up an appointment with either [facility] or [facility] to discuss counseling.    [facility]: [phone] [facility] Psychology: [phone]

## 2013-08-10 NOTE — Assessment & Plan Note (Signed)
CMET lipid panel today

## 2013-08-11 ENCOUNTER — Encounter: Payer: Self-pay | Admitting: Internal Medicine

## 2013-08-11 LAB — VITAMIN D 25 HYDROXY (VIT D DEFICIENCY, FRACTURES): VIT D 25 HYDROXY: 64 ng/mL (ref 30–89)

## 2013-08-25 ENCOUNTER — Telehealth: Payer: Self-pay | Admitting: *Deleted

## 2013-08-25 NOTE — Telephone Encounter (Signed)
Pt's appt was moved to 09-11-13 in the Am since she has to work in the afternoon on the 26th.  Appt card mailed to her regarding this new appt. [place] Kirchner,CMA

## 2013-08-25 NOTE — Telephone Encounter (Signed)
Message copied by Henri MedalHARTSELL, Dustyn Dansereau M on Fri Aug 25, 2013  9:50 AM ------      Message from: [facility] DAV[facility]      Created: Fri Aug 25, 2013  5:48 AM       My clinic has been moved to the afternoon on the 26th. Please call pt and see if she can come in then ------

## 2013-08-29 ENCOUNTER — Telehealth: Payer: Self-pay | Admitting: *Deleted

## 2013-08-29 NOTE — Telephone Encounter (Signed)
Pt is aware of results and has an appt 09-11-13. Jazmin Hartsell,CMA

## 2013-08-29 NOTE — Telephone Encounter (Signed)
Message copied by Henri MedalHARTSELL, JAZMIN M on Tue Aug 29, 2013  4:09 PM ------      Message from: [facility]      Created: Tue Aug 29, 2013  3:08 PM       Please call to inform of lab abnormalities. These warrant recheck in 1 mo from previous labs to ensure they are real and not lab error. Please call pt to have her come in for recheck on or after 28th. This can be for labs only if she wants. Just let me know and I'll put in the orders                  ----- Message -----         From: Jennette Billobert L Busick, CMA         Sent: 08/10/2013  11:50 AM           To: Ozella Rocksavid J Merrell, MD                   ------

## 2013-09-08 ENCOUNTER — Ambulatory Visit: Payer: No Typology Code available for payment source | Admitting: Family Medicine

## 2013-09-11 ENCOUNTER — Ambulatory Visit (INDEPENDENT_AMBULATORY_CARE_PROVIDER_SITE_OTHER): Payer: No Typology Code available for payment source | Admitting: Family Medicine

## 2013-09-11 VITALS — BP 150/88 | HR 80 | Temp 97.9°F | Wt 166.0 lb

## 2013-09-11 DIAGNOSIS — R5383 Other fatigue: Secondary | ICD-10-CM

## 2013-09-11 DIAGNOSIS — R142 Eructation: Secondary | ICD-10-CM

## 2013-09-11 DIAGNOSIS — F329 Major depressive disorder, single episode, unspecified: Secondary | ICD-10-CM

## 2013-09-11 DIAGNOSIS — F3289 Other specified depressive episodes: Secondary | ICD-10-CM

## 2013-09-11 DIAGNOSIS — Z91199 Patient's noncompliance with other medical treatment and regimen due to unspecified reason: Secondary | ICD-10-CM

## 2013-09-11 DIAGNOSIS — R141 Gas pain: Secondary | ICD-10-CM

## 2013-09-11 DIAGNOSIS — E039 Hypothyroidism, unspecified: Secondary | ICD-10-CM

## 2013-09-11 DIAGNOSIS — Z9114 Patient's other noncompliance with medication regimen: Secondary | ICD-10-CM

## 2013-09-11 DIAGNOSIS — G4700 Insomnia, unspecified: Secondary | ICD-10-CM

## 2013-09-11 DIAGNOSIS — R143 Flatulence: Secondary | ICD-10-CM

## 2013-09-11 DIAGNOSIS — F32A Depression, unspecified: Secondary | ICD-10-CM

## 2013-09-11 DIAGNOSIS — R5381 Other malaise: Secondary | ICD-10-CM

## 2013-09-11 DIAGNOSIS — Z9119 Patient's noncompliance with other medical treatment and regimen: Secondary | ICD-10-CM

## 2013-09-11 LAB — VITAMIN B12: Vitamin B-12: 1997 pg/mL — ABNORMAL HIGH (ref 211–911)

## 2013-09-11 LAB — TSH: TSH: 5.772 u[IU]/mL — AB (ref 0.350–4.500)

## 2013-09-11 NOTE — Progress Notes (Signed)
Debra [name] is a 58 y.o. female who [facility]  Depression: much improved. Pt very involved in church and becoming more in tune w/ the Lord.   Sleep: using trazodone and clonidine w/ benefit. Initially only trazodone w/ benefit but then started using clonidine. Still waking at 4am. Pt w/ poor sleep hygiene habits. Previously tried melatonin w/o benefit. Pt able to get back to sleep after 4am and sleeps to 9am. Feels well rested.   Labs: Taking Vit B12 supplement prior to last appt. Pt was taking double dose of OTC sublingual Vit B12. No palpitations, CP  The following portions of the patient's history were reviewed and updated as appropriate: allergies, current medications, past medical history, family and social history, and problem list.  Patient is a nonsmoker   Past Medical History  Diagnosis Date  . Reflux     ROS as above otherwise neg.    Medications reviewed. Current Outpatient Prescriptions  Medication Sig Dispense Refill  . aspirin 81 MG tablet Take 81 mg by mouth daily.      Marland Kitchen. glycerin adult 2 G SUPP Place 1 suppository rectally once.  10 each  prn  . meloxicam (MOBIC) 15 MG tablet Take 1 tablet (15 mg total) by mouth daily.  30 tablet  1  . senna (SENOKOT) 8.6 MG TABS tablet Take 2 tablets (17.2 mg total) by mouth 2 (two) times daily.  30 each  prn  . traZODone (DESYREL) 50 MG tablet Take 0.5-1 tablets (25-50 mg total) by mouth at bedtime as needed for sleep.  30 tablet  3   No current facility-administered medications for this visit.    Exam:  BP 150/88  Pulse 80  Temp(Src) 97.9 F (36.6 C) (Oral)  Wt 166 lb (75.297 kg) Gen: Well NAD HEENT: EOMI,  MMM Lungs: CTABL Nl WOB Heart: RRR no MRG Abd: NABS, NT, ND Exts: Non edematous BL  LE, warm and well perfused.   No results found for this or any previous visit (from the past 72 hour(s)).  A/P (as seen in Problem list)  Flatulence Resolved  Depression PHQ9: 5 today Resolved w/ increased  spiritual effort.  Doing well and no need for medications.   Insomnia No real improvement No evidence of OSA Poor sleep hygiene.  Discussed taking other peoples medications Pt aware and will try to stop clonidine  Continue trazodone PRN.   Labor abnormality Vit B12 likely from OTC overdosing. Pt has now cut back to nml daily dose TSH recheck to day If elevated will need to start replacement    Elevated BP likely from rebound from clonidine. F/u at next appt

## 2013-09-11 NOTE — Assessment & Plan Note (Signed)
PHQ9: 5 today Resolved w/ increased spiritual effort.  Doing well and no need for medications.

## 2013-09-11 NOTE — Assessment & Plan Note (Signed)
Resolved

## 2013-09-11 NOTE — Addendum Note (Signed)
Addended by: Konrad DoloresMERRELL, DAVID J on: 09/11/2013 10:01 AM   Modules accepted: Orders

## 2013-09-11 NOTE — Assessment & Plan Note (Signed)
Vit B12 likely from OTC overdosing. Pt has now cut back to nml daily dose TSH recheck to day If elevated will need to start replacement

## 2013-09-11 NOTE — Assessment & Plan Note (Addendum)
No real improvement No evidence of OSA Poor sleep hygiene.  Discussed taking other peoples medications Pt aware and will try to stop clonidine  Continue trazodone PRN.

## 2013-09-11 NOTE — Patient Instructions (Signed)
You are doing well. Please stop taking the clonidine and focus on good sleep hygiene to help you get to sleep The clonidine is causing your blood pressure to be elevated Please continue the trazodone as needed Please continue all your other medications as needed.  We will call you if your labs come back abnormal Please come back in 3 months

## 2013-09-12 ENCOUNTER — Telehealth: Payer: Self-pay | Admitting: *Deleted

## 2013-09-12 DIAGNOSIS — E039 Hypothyroidism, unspecified: Secondary | ICD-10-CM | POA: Insufficient documentation

## 2013-09-12 DIAGNOSIS — Z9114 Patient's other noncompliance with medication regimen: Secondary | ICD-10-CM | POA: Insufficient documentation

## 2013-09-12 MED ORDER — LEVOTHYROXINE SODIUM 50 MCG PO TABS
50.0000 ug | ORAL_TABLET | Freq: Every day | ORAL | Status: DC
Start: 2013-09-12 — End: 2013-11-17

## 2013-09-12 NOTE — Assessment & Plan Note (Signed)
Vit B12 remains elevated.  Pt continues to take supplementation above the recommended dosing on bottle.  Pt instructed to stop and will recheck labs in 1 mo

## 2013-09-12 NOTE — Assessment & Plan Note (Signed)
TSH confirmed as elevated Start levothyroxine at .  Recheck in 4 wks and titrate as necessary

## 2013-09-12 NOTE — Addendum Note (Signed)
Addended by: Konrad DoloresMERRELL, DAVID J on: 09/12/2013 11:30 AM   Modules accepted: Orders

## 2013-09-12 NOTE — Telephone Encounter (Signed)
Pt informed of below.  Will call back to scehdule appt. Fleeger, Maryjo RochesterJessica Dawn

## 2013-09-12 NOTE — Telephone Encounter (Signed)
Message copied by Osborne OmanFLEEGER, JESSICA D on Tue Sep 12, 2013 11:39 AM ------      Message from: [facility]      Created: Tue Sep 12, 2013 11:30 AM       Please call pt to inform of elevated TSH and need to start thyroid medication. This may help with overall mood. Return in 4 wks for repeat blood work or come in sooner if develops symptoms from synthroid of palpitations or chest pain. Pt B12 also very high and needs to stop supplementation completely. Recheck in 4 wks ------

## 2013-09-12 NOTE — Progress Notes (Signed)
Pt informed. Debra [name], Debra [name]  

## 2013-09-20 ENCOUNTER — Ambulatory Visit (AMBULATORY_SURGERY_CENTER): Payer: Self-pay | Admitting: *Deleted

## 2013-09-20 VITALS — Ht 65.0 in | Wt 167.2 lb

## 2013-09-20 DIAGNOSIS — Z1211 Encounter for screening for malignant neoplasm of colon: Secondary | ICD-10-CM

## 2013-09-20 MED ORDER — MOVIPREP 100 G PO SOLR: ORAL | Status: DC

## 2013-09-20 NOTE — Progress Notes (Signed)
No allergies to eggs or soy. No problems with anesthesia.  Pt given Emmi instructions for colonoscopy  No oxygen use  No diet drug use  Pt has problems with constipation: having a BM about twice a week.  Uses glycerin suppository once a week.  Pt given instructions for 2 day prep with Miralax and MoviPrep.

## 2013-10-04 ENCOUNTER — Encounter: Payer: Self-pay | Admitting: Internal Medicine

## 2013-10-04 ENCOUNTER — Ambulatory Visit (AMBULATORY_SURGERY_CENTER): Payer: No Typology Code available for payment source | Admitting: Internal Medicine

## 2013-10-04 VITALS — BP 133/91 | HR 72 | Temp 97.3°F | Resp 33 | Ht 65.0 in | Wt 167.0 lb

## 2013-10-04 DIAGNOSIS — Z1211 Encounter for screening for malignant neoplasm of colon: Secondary | ICD-10-CM

## 2013-10-04 MED ORDER — SODIUM CHLORIDE 0.9 % IV SOLN: 500.0000 mL | INTRAVENOUS | Status: DC

## 2013-10-04 NOTE — Patient Instructions (Signed)
YOU HAD AN ENDOSCOPIC PROCEDURE TODAY AT THE [facility]: Refer to the procedure report that was given to you for any specific questions about what was found during the examination.  If the procedure report does not answer your questions, please call your gastroenterologist to clarify.  If you requested that your care partner not be given the details of your procedure findings, then the procedure report has been included in a sealed envelope for you to review at your convenience later.  YOU SHOULD EXPECT: Some feelings of bloating in the abdomen. Passage of more gas than usual.  Walking can help get rid of the air that was put into your GI tract during the procedure and reduce the bloating. If you had a lower endoscopy (such as a colonoscopy or flexible sigmoidoscopy) you may notice spotting of blood in your stool or on the toilet paper. If you underwent a bowel prep for your procedure, then you may not have a normal bowel movement for a few days.  DIET: Your first meal following the procedure should be a light meal and then it is ok to progress to your normal diet.  A half-sandwich or bowl of soup is an example of a good first meal.  Heavy or fried foods are harder to digest and may make you feel nauseous or bloated.  Likewise meals heavy in dairy and vegetables can cause extra gas to form and this can also increase the bloating.  Drink plenty of fluids but you should avoid alcoholic beverages for 24 hours.  ACTIVITY: Your care partner should take you home directly after the procedure.  You should plan to take it easy, moving slowly for the rest of the day.  You can resume normal activity the day after the procedure however you should NOT DRIVE or use heavy machinery for 24 hours (because of the sedation medicines used during the test).    SYMPTOMS TO REPORT IMMEDIATELY: A gastroenterologist can be reached at any hour.  During normal business hours, 8:30 AM to 5:00 PM Monday through Friday,  call [phone].  After hours and on weekends, please call the GI answering service at [phone] who will take a message and have the physician on call contact you.   Following lower endoscopy (colonoscopy or flexible sigmoidoscopy):  Excessive amounts of blood in the stool  Significant tenderness or worsening of abdominal pains  Swelling of the abdomen that is new, acute  Fever of 100F or higher  FOLLOW UP: If any biopsies were taken you will be contacted by phone or by letter within the next 1-3 weeks.  Call your gastroenterologist if you have not heard about the biopsies in 3 weeks.  Our staff will call the home number listed on your records the next business day following your procedure to check on you and address any questions or concerns that you may have at that time regarding the information given to you following your procedure. This is a courtesy call and so if there is no answer at the home number and we have not heard from you through the emergency physician on call, we will assume that you have returned to your regular daily activities without incident.  SIGNATURES/CONFIDENTIALITY: You and/or your care partner have signed paperwork which will be entered into your electronic medical record.  These signatures attest to the fact that that the information above on your After Visit Summary has been reviewed and is understood.  Full responsibility of the confidentiality of this   discharge information lies with you and/or your care-partner.  Resume medications. Information given on diverticulosis and high fiber diet with discharge instructions. 

## 2013-10-04 NOTE — Progress Notes (Signed)
Procedure ends, to recovery, rewport given and VSS.

## 2013-10-04 NOTE — Op Note (Signed)
[facility] [address] [place], [id number]   COLONOSCOPY PROCEDURE REPORT  PATIENT: Debra [name], Debra L.  MR#: [id number] BIRTHDATE: 06-04-1955 , 58  yrs. old GENDER: Female ENDOSCOPIST: Hart Carwinora M Maxi Rodas, MD REFERRED BY:Dr Jacquelin Hawkingalph Nettey PROCEDURE DATE:  10/04/2013 PROCEDURE:   Colonoscopy, screening First Screening Colonoscopy - Avg.  risk and is 50 yrs.  old or older Yes.  Prior Negative Screening - Now for repeat screening. N/A  History of Adenoma - Now for follow-up colonoscopy & has been > or = to 3 yrs.  N/A  Polyps Removed Today? No.  Recommend repeat exam, <10 yrs? No. ASA CLASS:   Class II INDICATIONS:Average risk patient for colon cancer. MEDICATIONS: MAC sedation, administered by CRNA and Propofol (Diprivan) 230 mg IV  DESCRIPTION OF PROCEDURE:   After the risks benefits and alternatives of the procedure were thoroughly explained, informed consent was obtained.  A digital rectal exam revealed no abnormalities of the rectum.   The LB [id number]-[id number]  endoscope was introduced through the anus and advanced to the cecum, which was identified by both the appendix and ileocecal valve. No adverse events experienced.   The quality of the prep was good, using MoviPrep  The instrument was then slowly withdrawn as the colon was fully examined.      COLON FINDINGS: There was moderate diverticulosis noted in the descending colon with associated tortuosity and muscular hypertrophy.  Retroflexed views revealed no abnormalities. The time to cecum=6 minutes 41 seconds.  Withdrawal time=6 minutes 18 seconds.  The scope was withdrawn and the procedure completed. COMPLICATIONS: There were no complications.  ENDOSCOPIC IMPRESSION: There was moderate diverticulosis noted in the descending colon and ascending colon  RECOMMENDATIONS: High fiber diet Recall colonoscopy in 10 years   eSigned:  Hart Carwinora M Anela Bensman, MD 10/04/2013 11:57 AM   cc:   PATIENT NAME:  Debra [name],  Debra L. MR#: [id number]

## 2013-10-05 ENCOUNTER — Telehealth: Payer: Self-pay

## 2013-10-05 NOTE — Telephone Encounter (Signed)
Left message on answering machine. 

## 2013-10-23 ENCOUNTER — Encounter: Payer: Self-pay | Admitting: Family Medicine

## 2013-10-23 ENCOUNTER — Ambulatory Visit (INDEPENDENT_AMBULATORY_CARE_PROVIDER_SITE_OTHER): Payer: No Typology Code available for payment source | Admitting: Family Medicine

## 2013-10-23 VITALS — BP 142/83 | HR 75 | Temp 98.5°F | Ht 65.0 in | Wt 168.8 lb

## 2013-10-23 DIAGNOSIS — F17200 Nicotine dependence, unspecified, uncomplicated: Secondary | ICD-10-CM

## 2013-10-23 DIAGNOSIS — Z720 Tobacco use: Secondary | ICD-10-CM | POA: Insufficient documentation

## 2013-10-23 DIAGNOSIS — E039 Hypothyroidism, unspecified: Secondary | ICD-10-CM

## 2013-10-23 DIAGNOSIS — Z23 Encounter for immunization: Secondary | ICD-10-CM

## 2013-10-23 DIAGNOSIS — G4700 Insomnia, unspecified: Secondary | ICD-10-CM

## 2013-10-23 LAB — TSH: TSH: 1.142 u[IU]/mL (ref 0.350–4.500)

## 2013-10-23 LAB — VITAMIN B12: VITAMIN B 12: 1366 pg/mL — AB (ref 211–911)

## 2013-10-23 MED ORDER — TRAZODONE HCL 50 MG PO TABS: 25.0000 mg | ORAL_TABLET | Freq: Every evening | ORAL | Status: DC | PRN

## 2013-10-23 NOTE — Progress Notes (Signed)
    Subjective  Debra [name] is a 58 y.o. female that presents for a follow-up visit for chronic issues.  1. Hypothyroidism: Last TSH was high. Currently having symptoms of fatigue, but may be related to trouble sleeping. She is adherent to her regimen of levothyroxine.  2. Insomnia: Takes trazadone which helps her go to sleep. Has been taking 100mg  at times which helps.   3. Tobacco abuse: currently smokes 2-3 cigarettes a day. She would like to quit. She smokes because she becomes anxious. She notices that when she is working she does not smoke.   Past Medical History  Diagnosis Date  . Reflux   . Allergy   . Hypothyroidism     History  Substance Use Topics  . Smoking status: Current Every Day Smoker -- 0.10 packs/day for 30 years    Types: Cigarettes  . Smokeless tobacco: Never Used     Comment: 4-5 cigarettes per day  . Alcohol Use: Yes     Comment: occasional: twice a year     Objective  BP 142/83  Pulse 75  Temp(Src) 98.5 F (36.9 C) (Oral)  Ht 5\' 5"  (1.651 m)  Wt 168 lb 12.8 oz (76.567 kg)  BMI 28.09 kg/m2  Gen: well appearing. No distress Neck: no thyromegaly noted Heart: regular rate and rhythm  Assessment and Plan   Please refer to problem based charting of assessment and plan

## 2013-10-23 NOTE — Patient Instructions (Signed)
Debra [name], it was a pleasure seeing you today. Today we talked about:  Hypothyroidism: We will check your thyroid level. If it is high, we will adjust your medication  Insomnia: Try to cut back on having TV on. Continue Trazadone.    Smoking: Today you set a quit date of September 15th.   Elevated blood pressure: Your goal is <140/90. It was a little high today. I will recheck in 3 months.  Please see me in 3 months for a return visit.  If you have any questions or concerns, please do not hesitate to call the office at [phone].  Sincerely,  Jacquelin Hawkingalph Monty Spicher, MD   Hypothyroidism The thyroid is a large gland located in the lower front of your neck. The thyroid gland helps control metabolism. Metabolism is how your body handles food. It controls metabolism with the hormone thyroxine. When this gland is underactive (hypothyroid), it produces too little hormone.  CAUSES These include:   Absence or destruction of thyroid tissue.  Goiter due to iodine deficiency.  Goiter due to medications.  Congenital defects (since birth).  Problems with the pituitary. This causes a lack of TSH (thyroid stimulating hormone). This hormone tells the thyroid to turn out more hormone. SYMPTOMS  Lethargy (feeling as though you have no energy)  Cold intolerance  Weight gain (in spite of normal food intake)  Dry skin  Coarse hair  Menstrual irregularity (if severe, may lead to infertility)  Slowing of thought processes Cardiac problems are also caused by insufficient amounts of thyroid hormone. Hypothyroidism in the newborn is cretinism, and is an extreme form. It is important that this form be treated adequately and immediately or it will lead rapidly to retarded physical and mental development. DIAGNOSIS  To prove hypothyroidism, your caregiver may do blood tests and ultrasound tests. Sometimes the signs are hidden. It may be necessary for your caregiver to watch this illness with blood  tests either before or after diagnosis and treatment. TREATMENT  Low levels of thyroid hormone are increased by using synthetic thyroid hormone. This is a safe, effective treatment. It usually takes about four weeks to gain the full effects of the medication. After you have the full effect of the medication, it will generally take another four weeks for problems to leave. Your caregiver may start you on low doses. If you have had heart problems the dose may be gradually increased. It is generally not an emergency to get rapidly to normal. HOME CARE INSTRUCTIONS   Take your medications as your caregiver suggests. Let your caregiver know of any medications you are taking or start taking. Your caregiver will help you with dosage schedules.  As your condition improves, your dosage needs may increase. It will be necessary to have continuing blood tests as suggested by your caregiver.  Report all suspected medication side effects to your caregiver. SEEK MEDICAL CARE IF: Seek medical care if you develop:  Sweating.  Tremulousness (tremors).  Anxiety.  Rapid weight loss.  Heat intolerance.  Emotional swings.  Diarrhea.  Weakness. SEEK IMMEDIATE MEDICAL CARE IF:  You develop chest pain, an irregular heart beat (palpitations), or a rapid heart beat. MAKE SURE YOU:   Understand these instructions.  Will watch your condition.  Will get help right away if you are not doing well or get worse. Document Released: 03/02/2005 Document Revised: 05/25/2011 Document Reviewed: 10/21/2007 [facility] Patient Information 2015 [employer] [place] [place]. This information is not intended to replace advice given to you by your health  care provider. Make sure you discuss any questions you have with your health care provider.  

## 2013-10-24 ENCOUNTER — Telehealth: Payer: Self-pay | Admitting: *Deleted

## 2013-10-24 ENCOUNTER — Encounter: Payer: Self-pay | Admitting: Family Medicine

## 2013-10-24 NOTE — Assessment & Plan Note (Signed)
Continue trazadone

## 2013-10-24 NOTE — Telephone Encounter (Signed)
Message copied by Farrell OursEVANS, Nora Rooke K on Tue Oct 24, 2013  9:33 AM ------      Message from: Jacquelin HawkingNETTEY, RALPH A      Created: Tue Oct 24, 2013  1:08 AM       TSH within normal limits. No change to levothyroxine. B-12 is still elevated but coming down. Please inform patient. ------

## 2013-10-24 NOTE — Telephone Encounter (Signed)
Spoke with patient and informed her of below 

## 2013-10-24 NOTE — Assessment & Plan Note (Signed)
Recheck TSH today. Will adjust levothyroxine if necessary

## 2013-10-24 NOTE — Assessment & Plan Note (Signed)
Patient has set a quit date of September 15th. Will follow-up.

## 2013-11-07 ENCOUNTER — Other Ambulatory Visit: Payer: Self-pay | Admitting: *Deleted

## 2013-11-07 ENCOUNTER — Telehealth: Payer: Self-pay | Admitting: Family Medicine

## 2013-11-07 DIAGNOSIS — G4700 Insomnia, unspecified: Secondary | ICD-10-CM

## 2013-11-07 DIAGNOSIS — E039 Hypothyroidism, unspecified: Secondary | ICD-10-CM

## 2013-11-07 NOTE — Telephone Encounter (Signed)
Refill request for Trazodone.

## 2013-11-08 NOTE — Telephone Encounter (Signed)
Left message on patient'[name] voicemail.Debra [name]  

## 2013-11-08 NOTE — Telephone Encounter (Signed)
Medication already refilled at last office visit on 10/23/13.

## 2013-11-16 ENCOUNTER — Telehealth: Payer: Self-pay | Admitting: Family Medicine

## 2013-11-16 DIAGNOSIS — G4700 Insomnia, unspecified: Secondary | ICD-10-CM

## 2013-11-16 DIAGNOSIS — E039 Hypothyroidism, unspecified: Secondary | ICD-10-CM

## 2013-11-16 NOTE — Telephone Encounter (Signed)
Pt called and needs refills on her TrazoDone and Levothyroxine called in to [facility] since this is the pharmacy she has to use now. Please call in today. jw

## 2013-11-17 MED ORDER — LEVOTHYROXINE SODIUM 50 MCG PO TABS: 50.0000 ug | ORAL_TABLET | Freq: Every day | ORAL | Status: DC

## 2013-11-17 MED ORDER — TRAZODONE HCL 50 MG PO TABS: 25.0000 mg | ORAL_TABLET | Freq: Every evening | ORAL | Status: DC | PRN

## 2013-11-17 NOTE — Telephone Encounter (Signed)
LVM for patient to call back. ?

## 2013-11-17 NOTE — Telephone Encounter (Signed)
Left message on voicemail. Debra [name] S  

## 2013-11-17 NOTE — Telephone Encounter (Signed)
Medication refill sent .

## 2013-12-19 ENCOUNTER — Telehealth: Payer: Self-pay | Admitting: Family Medicine

## 2013-12-19 NOTE — Telephone Encounter (Signed)
Spoke with patient to verify the call and she stated that they need Dr. Dennison NancyNettey's information so that they can send him mammogram info.  ~~~Spoke with [facility] and gave them information needed. Address, phone and fax number, Doctor name

## 2013-12-19 NOTE — Telephone Encounter (Signed)
Pt asked us to call [facility] to give them Dr Bethann HumbleNetteys information. She has a mammogram scheduled for Oct 13

## 2014-02-23 ENCOUNTER — Encounter: Payer: Self-pay | Admitting: Family Medicine

## 2014-02-23 ENCOUNTER — Ambulatory Visit (INDEPENDENT_AMBULATORY_CARE_PROVIDER_SITE_OTHER): Payer: No Typology Code available for payment source | Admitting: Family Medicine

## 2014-02-23 VITALS — BP 136/90 | HR 84 | Temp 97.9°F | Ht 65.0 in | Wt 169.7 lb

## 2014-02-23 DIAGNOSIS — G4700 Insomnia, unspecified: Secondary | ICD-10-CM

## 2014-02-23 DIAGNOSIS — L720 Epidermal cyst: Secondary | ICD-10-CM

## 2014-02-23 DIAGNOSIS — L738 Other specified follicular disorders: Secondary | ICD-10-CM

## 2014-02-23 DIAGNOSIS — E039 Hypothyroidism, unspecified: Secondary | ICD-10-CM

## 2014-02-23 DIAGNOSIS — R229 Localized swelling, mass and lump, unspecified: Secondary | ICD-10-CM

## 2014-02-23 MED ORDER — LEVOTHYROXINE SODIUM 50 MCG PO TABS: 50.0000 ug | ORAL_TABLET | Freq: Every day | ORAL | Status: DC

## 2014-02-23 MED ORDER — TRAZODONE HCL 50 MG PO TABS: 50.0000 mg | ORAL_TABLET | Freq: Every evening | ORAL | Status: DC | PRN

## 2014-02-23 NOTE — Progress Notes (Signed)
    Subjective    Debra [name] is a 58 y.o. female that presents for an office visit.   1. Vaginal lesion: First noticed last week. Located on labia. Initially painful but now it is not painful. Has not had this issue before. She has not taken anything for this lesion. No fevers, nausea or vomiting. Currently not having sex. Last sexual encounter 4-5 years ago. She does not shave her genital area. No history of STI.  2. Lesion on right leg: First noticed last week. Lesion is not painful but is irritating. States sometimes there is a green or dark discoloration. She has not taken anything for that. No calf pain.   3. Hypothyroidism: Adherent to medication. No complaints. No side-effects 4. Lesion on neck: has been present for the past 5 years but noticed it grew about 2 years ago. Lesion is non-tender and flesh colored.   History  Substance Use Topics  . Smoking status: Current Every Day Smoker -- 0.10 packs/day for 30 years    Types: Cigarettes  . Smokeless tobacco: Never Used     Comment: 4-5 cigarettes per day  . Alcohol Use: Yes     Comment: occasional: twice a year    No Known Allergies  No orders of the defined types were placed in this encounter.    ROS  Per HPI   Objective   BP 136/90 mmHg  Pulse 84  Temp(Src) 97.9 F (36.6 C) (Oral)  Ht 5\' 5"  (1.651 m)  Wt 169 lb 11.2 oz (76.975 kg)  BMI 28.24 kg/m2  General: Well appearing female in no distresss Genitourinary: approx 1cm single papular lesion on inferior aspect of right vulva with no erythema. Non-tender.     Skin: 1cm freely movable subcutaneous nodule located at left neck with small dimple located centrally. Also has a small 1cm subcutaneous nodule on her right leg that is freely movable and non-tender  Assessment and Plan   Please refer to problem based charting of assessment and plan

## 2014-02-23 NOTE — Patient Instructions (Addendum)
Thank you for coming to see me today. It was a pleasure. Today we talked about:   Lesion on neck: this looks like an inclusion cyst. I have measured it and we will be watching it's growth.  Lesion on vagina: this looks like a hair bump. If this grows, changes colors/look or starts becoming painful and red, please return for a follow-up  Hypothyroidism: no changes to your medications  Lesion on leg: this looks like some fat under the skin. We will watch it for now.  Please make an appointment to see me in 3 months for follow-up.  If you have any questions or concerns, please do not hesitate to call the office at [phone].  Sincerely,  Jacquelin Hawkingalph Ellanie Oppedisano, MD

## 2014-02-25 DIAGNOSIS — L738 Other specified follicular disorders: Secondary | ICD-10-CM | POA: Insufficient documentation

## 2014-02-25 DIAGNOSIS — R229 Localized swelling, mass and lump, unspecified: Secondary | ICD-10-CM | POA: Insufficient documentation

## 2014-02-25 DIAGNOSIS — L720 Epidermal cyst: Secondary | ICD-10-CM | POA: Insufficient documentation

## 2014-02-25 NOTE — Assessment & Plan Note (Signed)
Located on right leg. Suspect lipoma. Will watch. Instructed patient on red flags.

## 2014-02-25 NOTE — Assessment & Plan Note (Signed)
Will continue to monitor. Currently not causing patient any discomfort. Currently 1cm. Instructed patient to watch lesion. Red flags reviewed with the patient.

## 2014-02-25 NOTE — Assessment & Plan Note (Signed)
Patient stable. Last TSH wnl. Will recheck 6 months from previous check. No changes today to medication regimen.

## 2014-02-25 NOTE — Assessment & Plan Note (Signed)
Lesion located on labia. Instructed patient to watch lesion. Does not appear infected. Told to keep area clean and dry. Warm compress to help area heal.

## 2014-05-28 ENCOUNTER — Other Ambulatory Visit: Payer: Self-pay | Admitting: Family Medicine

## 2014-05-28 DIAGNOSIS — E039 Hypothyroidism, unspecified: Secondary | ICD-10-CM

## 2014-05-28 NOTE — Telephone Encounter (Signed)
[facility] ref[facility] on  [address]  Also need it be for a 69mo supply.

## 2014-05-29 ENCOUNTER — Other Ambulatory Visit: Payer: Self-pay | Admitting: Family Medicine

## 2014-05-29 DIAGNOSIS — E039 Hypothyroidism, unspecified: Secondary | ICD-10-CM

## 2014-05-29 NOTE — Telephone Encounter (Signed)
LVM for pt to call back to discuss below. Debra [name], Debra [name]  

## 2014-05-29 NOTE — Telephone Encounter (Signed)
Pt called and doesn't understand why she was refused a refill on her Levothyroxine. She said she gets this from [facility] and they send all 3 months at one time and it takes a while for this to get processed. Can we go ahead and call this in. jw

## 2014-05-30 NOTE — Telephone Encounter (Signed)
Spoke with pt and she stated that she would like to have her Rx sent to [facility] now because [facility] is now charging her $34.00 and she said [facility] is [facility]4.[facility].  I told her I would send a message to Dr. Mal MistyNetty to see what he says.  I told her that I would get back with her once he responded.  Lamonte SakaiZimmerman Rumple, April D

## 2014-06-01 MED ORDER — LEVOTHYROXINE SODIUM 50 MCG PO TABS: 50.0000 ug | ORAL_TABLET | Freq: Every day | ORAL | Status: DC

## 2014-06-01 NOTE — Telephone Encounter (Signed)
Refill sent to [facility]  

## 2014-06-04 NOTE — Telephone Encounter (Signed)
Spoke with patient and she did pick up rx this past weekend

## 2014-07-05 ENCOUNTER — Ambulatory Visit (INDEPENDENT_AMBULATORY_CARE_PROVIDER_SITE_OTHER): Payer: No Typology Code available for payment source | Admitting: Family Medicine

## 2014-07-05 ENCOUNTER — Encounter: Payer: Self-pay | Admitting: Family Medicine

## 2014-07-05 VITALS — BP 132/81 | HR 87 | Temp 98.1°F | Ht 65.0 in | Wt 165.2 lb

## 2014-07-05 DIAGNOSIS — G4700 Insomnia, unspecified: Secondary | ICD-10-CM

## 2014-07-05 DIAGNOSIS — Z638 Other specified problems related to primary support group: Secondary | ICD-10-CM

## 2014-07-05 DIAGNOSIS — J209 Acute bronchitis, unspecified: Secondary | ICD-10-CM

## 2014-07-05 DIAGNOSIS — F439 Reaction to severe stress, unspecified: Secondary | ICD-10-CM

## 2014-07-05 MED ORDER — LEVOFLOXACIN 500 MG PO TABS: 500.0000 mg | ORAL_TABLET | Freq: Every day | ORAL | Status: DC

## 2014-07-05 MED ORDER — TRAZODONE HCL 50 MG PO TABS: 50.0000 mg | ORAL_TABLET | Freq: Every evening | ORAL | Status: DC | PRN

## 2014-07-05 NOTE — Progress Notes (Signed)
   Subjective:    Patient ID: Debra [name] L Sharron, female    DOB: 04/03/1955, 59 y.o.   MRN: [id number]  HPI: Pt presents to clinic for SDA visit for about 2 weeks of cough, congestion, productive of some phlegm, without fever or SOB. She has been taking ampicillin given to her by a friend for the past 2-3 days, but it hasn't helped. She has not taken any other medications. Of note, she is still smoking (many years) but has cut back to about 1 pack per week.  Of note, [facility] "lots of stressful issues in her life." She reports difficulty with family issues, but she does not feel unsafe at home. She feels some depression-type symptoms and poor sleep, but feels like "counseling is what [she] needs." She has been seen by Dr. Margot AblesMerrill in the past, then by Dr. Caleb PoppNettey, and trazodone has not helped. She denies frank SI / HI. She feels her religion is very helpful to her; however, she does want to continue it and needs a refill.  Review of Systems: As above. She has no rashes and no definite sick contacts but she does work at a [employer].     Objective:   Physical Exam BP 132/81 mmHg  Pulse 87  Temp(Src) 98.1 F (36.7 C) (Oral)  Ht 5\' 5"  (1.651 m)  Wt 165 lb 3.2 oz (74.934 kg)  BMI 27.49 kg/m2 Gen: non-toxic-appearing adult female in NAD HEENT: [place]/AT, EOMI, PERRLA, MMM, TM's clear bilaterally  Nasal mucosae and posterior oropharynx clear Neck: supple, normal ROM, no lymphadenopathy Cardio: RRR, no murmur appreciated Pulm: clear but slightly diminished breath sounds bilaterally with soft expiratory wheezes, normal WOB Abd: soft, nontender, BS+ Ext: warm, well-perfused, no LE edema     Assessment & Plan:  59yo female with possible bronchitis vs viral URI exacerbating COPD-type symptoms (no formal COPD but pt is a smoker of many years) - Rx for Levaquin 500 mg daily for 7 days; strongly advised against using leftover abx or abx from friends, in the future -  strongly recommended complete smoking cessation - continue OTC Mucinex, and advised against OTC or Rx cough medication, in order to help clear congestion rather than suppressing it - f/u PRN with Dr. Caleb PoppNettey, as needed  Of note, pt did request refill of trazodone and [facility]" - [facility] and advised her she does not need a referral - recommended f/u with [facility] and Dr. Caleb PoppNettey specifically for mood / sleep issues, as needed  Bobbye Mortonhristopher M Takoda Siedlecki, MD PGY-3, [facility] 07/05/2014, 12:50 PM

## 2014-07-05 NOTE — Patient Instructions (Signed)
Thank you for coming in, today!  I want to treat you for bronchitis with an antibiotic called Levaquin (levofloxacin). Take 1 pill per day for 7 days. You can continue to take Mucinex with the antibiotic. Call [facility] to set up an appointment. You should not need a specific referral. If you feel like you need help right away, call 911 or go to the emergency room.  Call or come back to see Dr. Caleb PoppNettey, as you need. Please feel free to call with any questions or concerns at any time, at [phone]. --Dr. Casper HarrisonStreet

## 2014-08-09 ENCOUNTER — Encounter: Payer: Self-pay | Admitting: Family Medicine

## 2014-08-09 ENCOUNTER — Ambulatory Visit (INDEPENDENT_AMBULATORY_CARE_PROVIDER_SITE_OTHER): Payer: No Typology Code available for payment source | Admitting: Family Medicine

## 2014-08-09 VITALS — BP 124/81 | HR 95 | Temp 98.4°F | Ht 65.0 in | Wt 167.0 lb

## 2014-08-09 DIAGNOSIS — J309 Allergic rhinitis, unspecified: Secondary | ICD-10-CM | POA: Diagnosis not present

## 2014-08-09 MED ORDER — MOMETASONE FUROATE 50 MCG/ACT NA SUSP: 2.0000 | Freq: Every day | NASAL | Status: DC

## 2014-08-09 MED ORDER — LORATADINE 10 MG PO TABS: 10.0000 mg | ORAL_TABLET | Freq: Every day | ORAL | Status: DC

## 2014-08-09 NOTE — Patient Instructions (Signed)
Start using Nasonex 2 sprays each nostril, once per day Start using Claritin 10 mg per day Use Afrin 2 sprays each nostril for 3 days Follow up with Dr. Caleb PoppNettey if no improvement in 2 weeks.  Thanks Tesoro CorporationBryan R. Paulina FusiHess, DO of [facility] 08/09/2014, 1:59 PM

## 2014-08-09 NOTE — Progress Notes (Signed)
  Subjective:    Debra [name] is a 59 y.o. female seen in consultation for evaluation of possible allergic rhinitis. Patient's symptoms include clear rhinorrhea, cough, itchy nose, postnasal drip and sneezing. These symptoms are ongoing now for 2 months without relief.  She has tried levaquin and cough medications without success. Current triggers include exposure to no known precipitant.   The patient has not had sinus surgery in the past.  Does smoke about 1 pk per week for the past 16 years.  Denies fever, chills, sweats, weight loss, fatigue.   The following portions of the patient's history were reviewed and updated as appropriate: allergies, current medications, past family history, past medical history, past social history, past surgical history and problem list.   Objective:    BP 124/81 mmHg  Pulse 95  Temp(Src) 98.4 F (36.9 C) (Oral)  Ht 5\' 5"  (1.651 m)  Wt 167 lb (75.751 kg)  BMI 27.79 kg/m2 General appearance: alert Eyes: conjunctivae/corneas clear. PERRL, EOM's intact. Fundi benign. Ears: normal TM's and external ear canals both ears  Nose: + erythema/edema of turbinates R> L Throat: O/P clear Lungs: clear to auscultation bilaterally    Assessment:     Allergic rhinitis    Plan:    Medication: medical regimen as noted. Discussed medication dosage, usage, side effects, and goals of treatment in detail. Aggressive environmental controls. Follow up in 2 weeks, sooner should new symptoms or problems arise.

## 2014-08-31 ENCOUNTER — Emergency Department (HOSPITAL_COMMUNITY): Payer: No Typology Code available for payment source

## 2014-08-31 ENCOUNTER — Encounter (HOSPITAL_COMMUNITY): Payer: Self-pay | Admitting: Emergency Medicine

## 2014-08-31 ENCOUNTER — Emergency Department (HOSPITAL_COMMUNITY)
Admission: EM | Admit: 2014-08-31 | Discharge: 2014-08-31 | Disposition: A | Discharge status: Home / Self Care | Payer: No Typology Code available for payment source | Attending: Emergency Medicine | Admitting: Emergency Medicine

## 2014-08-31 DIAGNOSIS — Z8719 Personal history of other diseases of the digestive system: Secondary | ICD-10-CM | POA: Insufficient documentation

## 2014-08-31 DIAGNOSIS — Z720 Tobacco use: Secondary | ICD-10-CM | POA: Insufficient documentation

## 2014-08-31 DIAGNOSIS — Y9389 Activity, other specified: Secondary | ICD-10-CM | POA: Insufficient documentation

## 2014-08-31 DIAGNOSIS — Z7951 Long term (current) use of inhaled steroids: Secondary | ICD-10-CM | POA: Diagnosis not present

## 2014-08-31 DIAGNOSIS — Z79899 Other long term (current) drug therapy: Secondary | ICD-10-CM | POA: Diagnosis not present

## 2014-08-31 DIAGNOSIS — M545 Low back pain, unspecified: Secondary | ICD-10-CM

## 2014-08-31 DIAGNOSIS — Y92410 Unspecified street and highway as the place of occurrence of the external cause: Secondary | ICD-10-CM | POA: Diagnosis not present

## 2014-08-31 DIAGNOSIS — Y998 Other external cause status: Secondary | ICD-10-CM | POA: Diagnosis not present

## 2014-08-31 DIAGNOSIS — S199XXA Unspecified injury of neck, initial encounter: Secondary | ICD-10-CM | POA: Diagnosis present

## 2014-08-31 DIAGNOSIS — S3992XA Unspecified injury of lower back, initial encounter: Secondary | ICD-10-CM | POA: Diagnosis not present

## 2014-08-31 DIAGNOSIS — S134XXA Sprain of ligaments of cervical spine, initial encounter: Secondary | ICD-10-CM | POA: Diagnosis not present

## 2014-08-31 DIAGNOSIS — E039 Hypothyroidism, unspecified: Secondary | ICD-10-CM | POA: Diagnosis not present

## 2014-08-31 DIAGNOSIS — S139XXA Sprain of joints and ligaments of unspecified parts of neck, initial encounter: Secondary | ICD-10-CM

## 2014-08-31 DIAGNOSIS — Z7982 Long term (current) use of aspirin: Secondary | ICD-10-CM | POA: Insufficient documentation

## 2014-08-31 MED ORDER — IBUPROFEN 800 MG PO TABS: 800.0000 mg | ORAL_TABLET | Freq: Three times a day (TID) | ORAL | Status: DC

## 2014-08-31 MED ORDER — METHOCARBAMOL 500 MG PO TABS: 500.0000 mg | ORAL_TABLET | Freq: Two times a day (BID) | ORAL | Status: DC
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
## 2014-08-31 NOTE — ED Notes (Signed)
Pt escorted to discharge window. Pt verbalized understanding discharge instructions. In no acute distress.  

## 2014-08-31 NOTE — ED Notes (Signed)
PA at bedside.

## 2014-12-06 ENCOUNTER — Ambulatory Visit (INDEPENDENT_AMBULATORY_CARE_PROVIDER_SITE_OTHER): Payer: No Typology Code available for payment source | Admitting: Family Medicine

## 2014-12-06 ENCOUNTER — Encounter: Payer: Self-pay | Admitting: Family Medicine

## 2014-12-06 VITALS — BP 146/78 | HR 86 | Ht 65.0 in | Wt 165.0 lb

## 2014-12-06 DIAGNOSIS — J309 Allergic rhinitis, unspecified: Secondary | ICD-10-CM | POA: Diagnosis not present

## 2014-12-06 DIAGNOSIS — Z23 Encounter for immunization: Secondary | ICD-10-CM | POA: Diagnosis not present

## 2014-12-06 DIAGNOSIS — E039 Hypothyroidism, unspecified: Secondary | ICD-10-CM

## 2014-12-06 DIAGNOSIS — I10 Essential (primary) hypertension: Secondary | ICD-10-CM

## 2014-12-06 LAB — COMPLETE METABOLIC PANEL WITH GFR
ALBUMIN: 4.1 g/dL (ref 3.6–5.1)
ALK PHOS: 67 U/L (ref 33–130)
ALT: 16 U/L (ref 6–29)
AST: 17 U/L (ref 10–35)
BILIRUBIN TOTAL: 0.2 mg/dL (ref 0.2–1.2)
BUN: 12 mg/dL (ref 7–25)
CO2: 29 mmol/L (ref 20–31)
CREATININE: 0.77 mg/dL (ref 0.50–1.05)
Calcium: 9.3 mg/dL (ref 8.6–10.4)
Chloride: 104 mmol/L (ref 98–110)
GFR, Est Non African American: 85 mL/min (ref >=60)
Glucose, Bld: 61 mg/dL — ABNORMAL LOW (ref 65–99)
Potassium: 4.3 mmol/L (ref 3.5–5.3)
Sodium: 143 mmol/L (ref 135–146)
TOTAL PROTEIN: 7.3 g/dL (ref 6.1–8.1)

## 2014-12-06 LAB — CBC WITH DIFFERENTIAL/PLATELET
BASOS ABS: 0.1 10*3/uL (ref 0.0–0.1)
Basophils Relative: 1 % (ref 0–1)
Eosinophils Absolute: 0.4 10*3/uL (ref 0.0–0.7)
Eosinophils Relative: 5 % (ref 0–5)
HCT: 37.7 % (ref 36.0–46.0)
HEMOGLOBIN: 12.7 g/dL (ref 12.0–15.0)
LYMPHS PCT: 37 % (ref 12–46)
Lymphs Abs: 3 10*3/uL (ref 0.7–4.0)
MCH: 32.6 pg (ref 26.0–34.0)
MCHC: 33.7 g/dL (ref 30.0–36.0)
MCV: 96.7 fL (ref 78.0–100.0)
MONOS PCT: 9 % (ref 3–12)
MPV: 10.3 fL (ref 8.6–12.4)
Monocytes Absolute: 0.7 10*3/uL (ref 0.1–1.0)
Neutro Abs: 3.9 10*3/uL (ref 1.7–7.7)
Neutrophils Relative %: 48 % (ref 43–77)
PLATELETS: 376 10*3/uL (ref 150–400)
RBC: 3.9 MIL/uL (ref 3.87–5.11)
RDW: 14.2 % (ref 11.5–15.5)
WBC: 8.1 10*3/uL (ref 4.0–10.5)

## 2014-12-06 LAB — LIPID PANEL
Cholesterol: 239 mg/dL — ABNORMAL HIGH (ref 125–200)
HDL: 53 mg/dL (ref >=46)
LDL Cholesterol: 161 mg/dL — ABNORMAL HIGH (ref <130)
TRIGLYCERIDES: 126 mg/dL (ref <150)
Total CHOL/HDL Ratio: 4.5 Ratio (ref <=5.0)
VLDL: 25 mg/dL (ref <30)

## 2014-12-06 MED ORDER — FLUTICASONE PROPIONATE 50 MCG/ACT NA SUSP: 2.0000 | Freq: Every day | NASAL | Status: DC

## 2014-12-06 MED ORDER — HYDROCHLOROTHIAZIDE 12.5 MG PO TABS: 12.5000 mg | ORAL_TABLET | Freq: Every day | ORAL | Status: DC

## 2014-12-06 NOTE — Assessment & Plan Note (Signed)
Recommended discontinuing Afrin. Will prescribe Flonase daily.

## 2014-12-06 NOTE — Assessment & Plan Note (Signed)
Patient with frequent enough elevated blood pressure. Possibly related to thyroid, but doubt. Will start on HCTZ 12.5mg  daily. Discussed side effects with patient.

## 2014-12-06 NOTE — Assessment & Plan Note (Signed)
Recheck TSH 

## 2014-12-06 NOTE — Progress Notes (Signed)
    Subjective   Debra [name] is a 59 y.o. female that presents for a same day visit  1. Hypertension: Patient presents after having a high blood pressure to 170/100+ at her dentist's office. She is asymptomatic. She is currently being treated for hypothyroidism with synthroid.  2. Allergies: Chronic issue. Patient complaining more of some nasal congestion. She is using loratadine and has also used Afrin infrequently.  3. Hypothyroidism: adherent with synthroid  ROS Per HPI  Social History  Substance Use Topics  . Smoking status: Current Every Day Smoker -- 0.10 packs/day for 30 years    Types: Cigarettes  . Smokeless tobacco: Never Used     Comment: 4-5 cigarettes per day  . Alcohol Use: Yes     Comment: occasional: twice a year    No Known Allergies  Objective   BP 146/78 mmHg  Pulse 86  Ht  (1.651 m)  Wt 165 lb (74.844 kg)  BMI 27.46 kg/m2  General: Well appearing HEENT: TMs normal, nares patent with mildly enlarged turbinates, oropharynx clear with multiple fillings  Assessment and Plan   Meds ordered this encounter  Medications  . hydrochlorothiazide (HYDRODIURIL) 12.5 MG tablet    Sig: Take 1 tablet (12.5 mg total) by mouth daily.    Dispense:  90 tablet    Refill:  3  . fluticasone (FLONASE) 50 MCG/ACT nasal spray    Sig: Place 2 sprays into both nostrils daily.    Dispense:  16 g    Refill:  6    Hypothyroidism Recheck TSH  Essential hypertension Patient with frequent enough elevated blood pressure. Possibly related to thyroid, but doubt. Will start on HCTZ 12.5mg  daily. Discussed side effects with patient.  Rhinitis, allergic Recommended discontinuing Afrin. Will prescribe Flonase daily.

## 2014-12-06 NOTE — Patient Instructions (Addendum)
Thank you for coming in today.  You look great! We are giving you the flu shot, as well as the pneumovax which is to protect you from pneumonia.   We are also checking your Liver, kidneys, cholesterol and thyroid today with the blood work. Start to think about when you are willing to quit and when you feel ready we can help you with medicines and patches. We are starting you on 12.5mg  of Hydrochlorothiazide once a day for your high blood pressure.  We are starting you on Flonase for better control of your allergies. 2 sprays per nostril per day. Using it everyday is the best you will not build tolerance to this medicine. We will call you if anything shows up on the labs.  Come back and see [place] in 3 months  Thank you again and below is some information on what you can do to help you quit.   Smoking Cessation Quitting smoking is important to your health and has many advantages. However, it is not always easy to quit since nicotine is a very addictive drug. Oftentimes, people try 3 times or more before being able to quit. This document explains the best ways for you to prepare to quit smoking. Quitting takes hard work and a lot of effort, but you can do it. ADVANTAGES OF QUITTING SMOKING  You will live longer, feel better, and live better.  Your body will feel the impact of quitting smoking almost immediately.  Within 20 minutes, blood pressure decreases. Your pulse returns to its normal level.  After 8 hours, carbon monoxide levels in the blood return to normal. Your oxygen level increases.  After 24 hours, the chance of having a heart attack starts to decrease. Your breath, hair, and body stop smelling like smoke.  After 48 hours, damaged nerve endings begin to recover. Your sense of taste and smell improve.  After 72 hours, the body is virtually free of nicotine. Your bronchial tubes relax and breathing becomes easier.  After 2 to 12 weeks, lungs can hold more air. Exercise becomes  easier and circulation improves.  The risk of having a heart attack, stroke, cancer, or lung disease is greatly reduced.  After 1 year, the risk of coronary heart disease is cut in half.  After 5 years, the risk of stroke falls to the same as a nonsmoker.  After 10 years, the risk of lung cancer is cut in half and the risk of other cancers decreases significantly.  After 15 years, the risk of coronary heart disease drops, usually to the level of a nonsmoker.  If you are pregnant, quitting smoking will improve your chances of having a healthy baby.  The people you live with, especially any children, will be healthier.  You will have extra money to spend on things other than cigarettes. QUESTIONS TO THINK ABOUT BEFORE ATTEMPTING TO QUIT You may want to talk about your answers with your health care provider.  Why do you want to quit?  If you tried to quit in the past, what helped and what did not?  What will be the most difficult situations for you after you quit? How will you plan to handle them?  Who can help you through the tough times? Your family? Friends? A health care provider?  What pleasures do you get from smoking? What ways can you still get pleasure if you quit? Here are some questions to ask your health care provider:  How can you help me to be successful  at quitting?  What medicine do you think would be best for me and how should I take it?  What should I do if I need more help?  What is smoking withdrawal like? How can I get information on withdrawal? GET READY  Set a quit date.  Change your environment by getting rid of all cigarettes, ashtrays, matches, and lighters in your home, car, or work. Do not let people smoke in your home.  Review your past attempts to quit. Think about what worked and what did not. GET SUPPORT AND ENCOURAGEMENT You have a better chance of being successful if you have help. You can get support in many ways.  Tell your family,  friends, and coworkers that you are going to quit and need their support. Ask them not to smoke around you.  Get individual, group, or telephone counseling and support. Programs are available at [employer] and health centers. Call your local health department for information about programs in your area.  Spiritual beliefs and practices may help some smokers quit.  Download a "quit meter" on your computer to keep track of quit statistics, such as how long you have gone without smoking, cigarettes not smoked, and money saved.  Get a self-help book about quitting smoking and staying off tobacco. LEARN NEW SKILLS AND BEHAVIORS  Distract yourself from urges to smoke. Talk to someone, go for a walk, or occupy your time with a task.  Change your normal routine. Take a different route to work. Drink tea instead of coffee. Eat breakfast in a different place.  Reduce your stress. Take a hot bath, exercise, or read a book.  Plan something enjoyable to do every day. Reward yourself for not smoking.  Explore interactive web-based programs that specialize in helping you quit. GET MEDICINE AND USE IT CORRECTLY Medicines can help you stop smoking and decrease the urge to smoke. Combining medicine with the above behavioral methods and support can greatly increase your chances of successfully quitting smoking.  Nicotine replacement therapy helps deliver nicotine to your body without the negative effects and risks of smoking. Nicotine replacement therapy includes nicotine gum, lozenges, inhalers, nasal sprays, and skin patches. Some may be available over-the-counter and others require a prescription.  Antidepressant medicine helps people abstain from smoking, but how this works is unknown. This medicine is available by prescription.  Nicotinic receptor partial agonist medicine simulates the effect of nicotine in your brain. This medicine is available by prescription. Ask your health care provider for  advice about which medicines to use and how to use them based on your health history. Your health care provider will tell you what side effects to look out for if you choose to be on a medicine or therapy. Carefully read the information on the package. Do not use any other product containing nicotine while using a nicotine replacement product.  RELAPSE OR DIFFICULT SITUATIONS Most relapses occur within the first 3 months after quitting. Do not be discouraged if you start smoking again. Remember, most people try several times before finally quitting. You may have symptoms of withdrawal because your body is used to nicotine. You may crave cigarettes, be irritable, feel very hungry, cough often, get headaches, or have difficulty concentrating. The withdrawal symptoms are only temporary. They are strongest when you first quit, but they will go away within 10-14 days. To reduce the chances of relapse, try to:  Avoid drinking alcohol. Drinking lowers your chances of successfully quitting.  Reduce the amount of caffeine  you consume. Once you quit smoking, the amount of caffeine in your body increases and can give you symptoms, such as a rapid heartbeat, sweating, and anxiety.  Avoid smokers because they can make you want to smoke.  Do not let weight gain distract you. Many smokers will gain weight when they quit, usually less than 10 pounds. Eat a healthy diet and stay active. You can always lose the weight gained after you quit.  Find ways to improve your mood other than smoking. FOR MORE INFORMATION  www.smokefree.gov  Document Released: 02/24/2001 Document Revised: 07/17/2013 Document Reviewed: 06/11/2011 [facility] Patient Information 2015 [place], [place]. This information is not intended to replace advice given to you by your health care provider. Make sure you discuss any questions you have with your health care provider.

## 2014-12-07 LAB — TSH: TSH: 3.816 u[IU]/mL (ref 0.350–4.500)

## 2014-12-12 NOTE — Addendum Note (Signed)
Addended by: Lamonte Sakai, APRIL D on: 12/12/2014 12:09 PM   Modules accepted: Orders

## 2014-12-14 ENCOUNTER — Telehealth: Payer: Self-pay | Admitting: *Deleted

## 2014-12-14 NOTE — Telephone Encounter (Signed)
Patient left message on nurse line requesting her test results from last office visit. Please give her a call back at [phone].  Clovis Pu, RN

## 2014-12-23 ENCOUNTER — Other Ambulatory Visit: Payer: Self-pay | Admitting: Family Medicine

## 2014-12-24 NOTE — Telephone Encounter (Signed)
[facility] need RX for pt to get refilled.

## 2014-12-24 NOTE — Telephone Encounter (Signed)
Called patient to discuss lab results. Left message.

## 2014-12-25 NOTE — Telephone Encounter (Signed)
Pt returned call

## 2014-12-28 ENCOUNTER — Encounter: Payer: Self-pay | Admitting: Family Medicine

## 2015-03-28 ENCOUNTER — Telehealth: Payer: Self-pay | Admitting: Family Medicine

## 2015-03-28 NOTE — Telephone Encounter (Signed)
Pt called and needs to speak to one of Dr. Dennison NancyNettey's nurses about her medication now that she has changed insurance they do not cover the some of her current medication and will need different brands. jw

## 2015-04-08 NOTE — Telephone Encounter (Signed)
Called both home number and mobile number. LVM on mobile number, no answer on home number. Sunday Spillers, CMA

## 2015-05-29 ENCOUNTER — Other Ambulatory Visit: Payer: Self-pay | Admitting: Infectious Disease

## 2015-05-29 ENCOUNTER — Ambulatory Visit
Admission: RE | Admit: 2015-05-29 | Discharge: 2015-05-29 | Disposition: A | Discharge status: Home / Self Care | Payer: No Typology Code available for payment source | Source: Ambulatory Visit | Attending: Infectious Disease | Admitting: Infectious Disease

## 2015-05-29 DIAGNOSIS — R7611 Nonspecific reaction to tuberculin skin test without active tuberculosis: Secondary | ICD-10-CM

## 2015-06-27 ENCOUNTER — Other Ambulatory Visit: Payer: Self-pay | Admitting: Family Medicine

## 2015-06-27 NOTE — Telephone Encounter (Signed)
Patient needs an appointment. Please inform. Thanks!

## 2015-06-27 NOTE — Telephone Encounter (Signed)
PT informed and scheduled for 4/26 for a check up. Lamonte SakaiZimmerman Rumple, Hilary Pundt D, [place]

## 2015-07-10 ENCOUNTER — Encounter: Payer: Self-pay | Admitting: Family Medicine

## 2015-07-10 ENCOUNTER — Ambulatory Visit (INDEPENDENT_AMBULATORY_CARE_PROVIDER_SITE_OTHER): Payer: BLUE CROSS/BLUE SHIELD | Admitting: Family Medicine

## 2015-07-10 VITALS — BP 117/78 | HR 82 | Temp 97.7°F | Ht 65.0 in | Wt 168.1 lb

## 2015-07-10 DIAGNOSIS — Z1159 Encounter for screening for other viral diseases: Secondary | ICD-10-CM | POA: Diagnosis not present

## 2015-07-10 DIAGNOSIS — E039 Hypothyroidism, unspecified: Secondary | ICD-10-CM

## 2015-07-10 DIAGNOSIS — R21 Rash and other nonspecific skin eruption: Secondary | ICD-10-CM

## 2015-07-10 DIAGNOSIS — Z114 Encounter for screening for human immunodeficiency virus [HIV]: Secondary | ICD-10-CM

## 2015-07-10 DIAGNOSIS — I10 Essential (primary) hypertension: Secondary | ICD-10-CM

## 2015-07-10 DIAGNOSIS — E7800 Pure hypercholesterolemia, unspecified: Secondary | ICD-10-CM

## 2015-07-10 LAB — TSH: TSH: 8.15 mIU/L — ABNORMAL HIGH

## 2015-07-10 MED ORDER — LEVOTHYROXINE SODIUM 50 MCG PO TABS: 50.0000 ug | ORAL_TABLET | Freq: Every day | ORAL | Status: DC

## 2015-07-10 MED ORDER — CLOTRIMAZOLE-BETAMETHASONE 1-0.05 % EX CREA: 1.0000 "application " | TOPICAL_CREAM | Freq: Two times a day (BID) | CUTANEOUS | Status: DC

## 2015-07-10 NOTE — Assessment & Plan Note (Signed)
Stable per last TSH will recheck TSH today. Refill synthroid 

## 2015-07-10 NOTE — Patient Instructions (Signed)
Thank you for coming to see me today. It was a pleasure. Today we talked about:   Hypothyroidism: I will check your thyroid hormone. If it is regular, I will refill your medication  Please make an appointment to see me in 5 months.  If you have any questions or concerns, please do not hesitate to call the office at [phone].  Sincerely,  Jacquelin Hawkingalph Veldon Wager, MD

## 2015-07-10 NOTE — Progress Notes (Signed)
    Subjective    Debra [name] is a 60 y.o. female that presents for a follow-up visit for:   1. Hypothyroidism: Patient is adherent with synthroid 50mcg daily. She reports that she is asymptomatic.   2. Hypercholesterolemia: Patient states she has cut down on fried foods. She is not wanting to start any new medication. Her last LDL was 161.   3. Hypertension: Patient is adherent with hydrochlorothiazide 12.5mg  daily. She reports no chest pain or dyspnea. No swelling.  Social History  Substance Use Topics  . Smoking status: Current Every Day Smoker -- 0.10 packs/day for 30 years    Types: Cigarettes  . Smokeless tobacco: Never Used     Comment: 4-5 cigarettes per day  . Alcohol Use: Yes     Comment: occasional: twice a year    No Known Allergies  No orders of the defined types were placed in this encounter.    ROS  Per HPI   Objective   BP 117/78 mmHg  Pulse 82  Temp(Src) 97.7 F (36.5 C) (Oral)  Ht 5\' 5"  (1.651 m)  Wt 168 lb 1.6 oz (76.25 kg)  BMI 27.97 kg/m2  Vital signs reviewed  General: Well appearing, no distress  Assessment and Plan    Hypothyroidism Stable per last TSH will recheck TSH today. Refill synthroid 50mcg  Pure hypercholesterolemia Patient declining medication management at this time. She is opting for dietary modification. Discussed her specific risk for heart attack/stroke. Patient would like to consider medication management in the future but not at this time.  Essential hypertension Controlled. Continue hydrochlorothiazide 12.5mg  daily

## 2015-07-10 NOTE — Assessment & Plan Note (Signed)
Patient declining medication management at this time. She is opting for dietary modification. Discussed her specific risk for heart attack/stroke. Patient would like to consider medication management in the future but not at this time.

## 2015-07-10 NOTE — Assessment & Plan Note (Signed)
Controlled -Continue hydrochlorothiazide 12.5 mg daily 

## 2015-07-11 LAB — HEPATITIS C ANTIBODY: HCV Ab: NEGATIVE

## 2015-07-11 LAB — HIV ANTIBODY (ROUTINE TESTING W REFLEX): HIV: NONREACTIVE

## 2015-07-12 ENCOUNTER — Telehealth: Payer: Self-pay | Admitting: Family Medicine

## 2015-07-12 DIAGNOSIS — E039 Hypothyroidism, unspecified: Secondary | ICD-10-CM

## 2015-07-12 MED ORDER — LEVOTHYROXINE SODIUM 50 MCG PO TABS: 75.0000 ug | ORAL_TABLET | Freq: Every day | ORAL | Status: DC

## 2015-07-12 NOTE — Telephone Encounter (Signed)
Physician Telephone Note  Reason for call: Results  Discussion with patient: Patient not available. Left message for her to return call. Called to discuss results. Hepatitis C and HIV are negative. TSH is elevated. Recommend increasing Synthroid. Will increase to Synthroid 75mcg (micrograms) daily, which is increased from 50mcg daily. Since she just refilled her medications, she can take 1.5 tablets daily of her 50mcg tablets, which should last her until 6 weeks. [facility] She will need a repeat TSH in 6 weeks.   Meds ordered this encounter  Medications  . levothyroxine (SYNTHROID, LEVOTHROID) 50 MCG tablet    Sig: Take 1.5 tablets (75 mcg total) by mouth daily.    Dispense:  90 tablet    Refill:  0    Jacquelin Hawkingalph Tearah Saulsbury, MD PGY-3, [facility] 07/12/2015, 3:28 PM

## 2015-07-16 NOTE — Telephone Encounter (Signed)
LVM for pt to call back to inform her of below. Zimmerman Rumple, Ron Junco D, CMA  

## 2015-07-17 NOTE — Telephone Encounter (Signed)
LVM for pt to call back to inform her of below. Zimmerman Rumple, Rylie Limburg D, CMA  

## 2015-07-17 NOTE — Telephone Encounter (Signed)
Pt advised.

## 2015-07-18 ENCOUNTER — Other Ambulatory Visit: Payer: Self-pay | Admitting: Family Medicine

## 2015-07-18 DIAGNOSIS — E039 Hypothyroidism, unspecified: Secondary | ICD-10-CM

## 2015-07-18 MED ORDER — LEVOTHYROXINE SODIUM 75 MCG PO TABS: 75.0000 ug | ORAL_TABLET | Freq: Every day | ORAL | Status: DC

## 2015-07-18 NOTE — Telephone Encounter (Signed)
Pt called because her prescription of Levothyroxine was increased from 50 MCG to 75 MCG and she needs a new prescription sent to her pharmacy. I see that in Epic that the doctor wrote a new prescription on the 07/12/15 but the pharmacy doesn't have this. jw

## 2015-07-18 NOTE — Telephone Encounter (Signed)
Refill sent.

## 2015-07-19 NOTE — Telephone Encounter (Signed)
LVM for pt to call. If pt calls, please inform her of the info below. Debra SpillersSharon T Tanajah Boulter, CMA

## 2015-07-22 ENCOUNTER — Other Ambulatory Visit: Payer: Self-pay | Admitting: Family Medicine

## 2015-07-22 DIAGNOSIS — E039 Hypothyroidism, unspecified: Secondary | ICD-10-CM

## 2015-07-30 ENCOUNTER — Telehealth: Payer: Self-pay | Admitting: Family Medicine

## 2015-07-30 NOTE — Telephone Encounter (Signed)
Pt called and would like the doctor to call her and give her the lab results. jw

## 2015-08-01 NOTE — Telephone Encounter (Signed)
I am unsure of as to which lab results she is referring. Okay to inform her of whatever lab she is looking for. The only recent lab I see is a TSH and she was already called about that lab result.

## 2015-08-02 NOTE — Telephone Encounter (Signed)
LVM to call office to inquire about below message. Lamonte SakaiZimmerman Rumple, Katieann Hungate D, [place]

## 2015-09-23 ENCOUNTER — Other Ambulatory Visit: Payer: Self-pay | Admitting: *Deleted

## 2015-09-23 DIAGNOSIS — E039 Hypothyroidism, unspecified: Secondary | ICD-10-CM

## 2015-09-23 MED ORDER — LEVOTHYROXINE SODIUM 75 MCG PO TABS: 75.0000 ug | ORAL_TABLET | Freq: Every day | ORAL | Status: DC

## 2015-11-01 ENCOUNTER — Ambulatory Visit (INDEPENDENT_AMBULATORY_CARE_PROVIDER_SITE_OTHER): Payer: BLUE CROSS/BLUE SHIELD | Admitting: Internal Medicine

## 2015-11-01 VITALS — BP 94/58 | HR 88 | Temp 97.7°F | Ht 65.0 in | Wt 163.4 lb

## 2015-11-01 DIAGNOSIS — E039 Hypothyroidism, unspecified: Secondary | ICD-10-CM | POA: Diagnosis not present

## 2015-11-01 DIAGNOSIS — R202 Paresthesia of skin: Secondary | ICD-10-CM

## 2015-11-01 LAB — CBC
HCT: 32.8 % — ABNORMAL LOW (ref 35.0–45.0)
HEMOGLOBIN: 11.4 g/dL — AB (ref 11.7–15.5)
MCH: 32.9 pg (ref 27.0–33.0)
MCHC: 34.8 g/dL (ref 32.0–36.0)
MCV: 94.5 fL (ref 80.0–100.0)
MPV: 9.9 fL (ref 7.5–12.5)
Platelets: 346 10*3/uL (ref 140–400)
RBC: 3.47 MIL/uL — ABNORMAL LOW (ref 3.80–5.10)
RDW: 14 % (ref 11.0–15.0)
WBC: 11.1 10*3/uL — ABNORMAL HIGH (ref 3.8–10.8)

## 2015-11-01 NOTE — Progress Notes (Signed)
   Subjective:    Debra [name] - 60 y.o. female MRN [id number]  Date of birth: 05/04/1955  HPI  Debra LipsCarol L Michalowski is here for bilateral toe numbness.  Toe Paresthesias: Noticed numbness/tingling in her last 3 toes bilaterally about 4 weeks ago. Thought it may be related to wearing tennis shoes and socks so she has been trying to wear sandals or go barefoot the past couple of days. Has not noticed any improvement with these changes. Numbness/tingling seems to be worse at bedtime. Denies associated coolness of feet. Denies cyanosis of toes, erythema, or other skin color changes. Denies pain to the area. Numbness does not radiate to other areas of the foot or up the leg. Has not had any difficulty bearing weight.    -  reports that she has been smoking Cigarettes.  She has a 3.00 pack-year smoking history. She has never used smokeless tobacco. - Review of Systems: Per HPI. - Past Medical History: Patient Active Problem List   Diagnosis Date Noted  . Paresthesia 11/01/2015  . Essential hypertension 12/06/2014  . Rhinitis, allergic 12/06/2014  . Epidermoid cyst of neck 02/25/2014  . Subcutaneous nodule 02/25/2014  . 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 Tobacco abuse 10/23/2013  . Hypothyroidism 09/12/2013  . Overuse of medication 09/12/2013  . Fatigue 09/11/2013  . Routine health maintenance 08/10/2013  . Adductor tendinitis 10/12/2012  . Insomnia 08/03/2012  . Pure hypercholesterolemia 07/08/2012  . Strain of hip flexor 07/04/2012  . BMI 33.0-33.9,adult 07/04/2012  . H/O hematuria 07/04/2012  . Counseling on substance use and abuse 07/04/2012   - Medications: reviewed and updated    Objective:   Physical Exam BP (!) 94/58 (BP Location: Left Arm, Patient Position: Sitting, Cuff Size: Normal)   Pulse 88   Temp 97.7 F (36.5 C) (Oral)   Ht 5\' 5"  (1.651 m)   Wt 163 lb 6.4 oz (74.1 kg)   SpO2 97%   BMI 27.19 kg/m  Gen: NAD, alert, cooperative with exam, well-appearing CV: 2+ pedal  pulses, cap refill brisk, homan's sign negative bilaterally, no TTP of calves or palpable cords   Skin: no rashes or skin color changes over LE.  Neuro: Sensation to fine and gross touch intact over all toes bilaterally. Strength 5/5 in LE bilaterally. Able to wiggle toes normally. Normal gait.    Assessment & Plan:   Paresthesia Of toes bilaterally. Potentially related to patient's hypothyroidism. TSH elevated to 8.15 in April 2017 and levothyroxine subsequently increased to 75 mcg. No recent TSH level. Potentially low folate or B12. No signs of DVT on exam which patient had been concerned about. Doubt Raynaud's as patient has not had associated coldness or skin color changes of toes.  -TSH ordered  -CBC, B12 and folate ordered  -if results normal, could do trial of gabapentin     Marcy Sirenatherine Ambrose Wile, D.O. 11/01/2015, 5:17 PM PGY-2, [facility]

## 2015-11-01 NOTE — Patient Instructions (Signed)
We will send you your lab results and let you know if we need to change the dose of your thyroid medication.   Nice to meet you!   -Dr. Earlene PlaterWallace

## 2015-11-01 NOTE — Assessment & Plan Note (Signed)
Of toes bilaterally. Potentially related to patient's hypothyroidism. TSH elevated to 8.15 in April 2017 and levothyroxine subsequently increased to 75 mcg. No recent TSH level. Potentially low folate or B12. No signs of DVT on exam which patient had been concerned about. Doubt Raynaud's as patient has not had associated coldness or skin color changes of toes.  -TSH ordered  -CBC, B12 and folate ordered  -if results normal, could do trial of gabapentin

## 2015-11-02 LAB — TSH: TSH: 0.41 m[IU]/L

## 2015-11-02 LAB — VITAMIN B12: Vitamin B-12: 1503 pg/mL — ABNORMAL HIGH (ref 200–1100)

## 2015-11-02 LAB — FOLATE: Folate: 16.7 ng/mL (ref >5.4)

## 2015-11-08 ENCOUNTER — Encounter: Payer: Self-pay | Admitting: Internal Medicine

## 2015-12-02 ENCOUNTER — Other Ambulatory Visit: Payer: Self-pay | Admitting: *Deleted

## 2015-12-03 MED ORDER — HYDROCHLOROTHIAZIDE 12.5 MG PO TABS: 12.5000 mg | ORAL_TABLET | Freq: Every day | ORAL | 3 refills | Status: DC

## 2015-12-16 ENCOUNTER — Telehealth: Payer: Self-pay | Admitting: Internal Medicine

## 2015-12-16 NOTE — Telephone Encounter (Signed)
[facility] prescription [facility]roid medicine.  [facility] [address]  It is a 90 day supply. She has about 3 days left

## 2015-12-17 MED ORDER — LEVOTHYROXINE SODIUM 75 MCG PO TABS: 75.0000 ug | ORAL_TABLET | Freq: Every day | ORAL | 1 refills | Status: DC

## 2015-12-17 NOTE — Telephone Encounter (Signed)
LVM for pt to call the office. If pt calls, please give her the info below. Abir Craine T Tiburcio Linder, CMA' 

## 2015-12-17 NOTE — Telephone Encounter (Signed)
Refill for levothyroxine sent.   Marcy Sirenatherine Marlea Gambill, D.O. 12/17/2015, 8:56 AM PGY-2, [facility]

## 2016-03-13 ENCOUNTER — Other Ambulatory Visit: Payer: Self-pay | Admitting: *Deleted

## 2016-03-13 MED ORDER — LEVOTHYROXINE SODIUM 75 MCG PO TABS: 75.0000 ug | ORAL_TABLET | Freq: Every day | ORAL | 1 refills | Status: DC

## 2016-08-03 NOTE — Progress Notes (Signed)
Cancelling order as lab not collected, no need to re-order 

## 2016-09-15 ENCOUNTER — Other Ambulatory Visit: Payer: Self-pay | Admitting: Internal Medicine

## 2016-09-15 MED ORDER — LEVOTHYROXINE SODIUM 75 MCG PO TABS: 75.0000 ug | ORAL_TABLET | Freq: Every day | ORAL | 0 refills | Status: DC

## 2016-09-15 NOTE — Telephone Encounter (Signed)
Please call patient and schedule an appointment for follow up of thyroid disorder. Have sent in 30 day supply of Synthroid.   Marcy Sirenatherine Wallace, D.O. 09/15/2016, 10:35 AM PGY-3, [facility]

## 2016-09-15 NOTE — Telephone Encounter (Signed)
pt calling to request refill of:  Name of Medication(s):  levothyroxine Pharmacy:  [facility]  Request 30 day supply  Will route refill request to Clinic RN.  Discussed with patient policy to call pharmacy for future refills.  Also, discussed refills may take up to 48 hours to approve or deny.  Avanell ShackletonHarriet C Shelton

## 2016-09-15 NOTE — Telephone Encounter (Signed)
Pt was advised, stated she will call back another time to make an appointment. ep

## 2016-11-04 ENCOUNTER — Encounter: Payer: Self-pay | Admitting: Licensed Clinical Social Worker

## 2016-11-04 ENCOUNTER — Encounter: Payer: Self-pay | Admitting: Internal Medicine

## 2016-11-04 ENCOUNTER — Ambulatory Visit (INDEPENDENT_AMBULATORY_CARE_PROVIDER_SITE_OTHER): Payer: BLUE CROSS/BLUE SHIELD | Admitting: Internal Medicine

## 2016-11-04 VITALS — BP 132/80 | HR 80 | Temp 97.7°F | Wt 154.0 lb

## 2016-11-04 DIAGNOSIS — E785 Hyperlipidemia, unspecified: Secondary | ICD-10-CM | POA: Diagnosis not present

## 2016-11-04 DIAGNOSIS — F419 Anxiety disorder, unspecified: Secondary | ICD-10-CM | POA: Diagnosis not present

## 2016-11-04 DIAGNOSIS — Z0000 Encounter for general adult medical examination without abnormal findings: Secondary | ICD-10-CM | POA: Diagnosis not present

## 2016-11-04 DIAGNOSIS — I10 Essential (primary) hypertension: Secondary | ICD-10-CM

## 2016-11-04 DIAGNOSIS — E039 Hypothyroidism, unspecified: Secondary | ICD-10-CM

## 2016-11-04 MED ORDER — HYDROCHLOROTHIAZIDE 12.5 MG PO TABS: 12.5000 mg | ORAL_TABLET | Freq: Every day | ORAL | 3 refills | Status: DC

## 2016-11-04 MED ORDER — LEVOTHYROXINE SODIUM 75 MCG PO TABS: 75.0000 ug | ORAL_TABLET | Freq: Every day | ORAL | 3 refills | Status: DC

## 2016-11-04 MED ORDER — FLUOXETINE HCL 10 MG PO CAPS: 10.0000 mg | ORAL_CAPSULE | Freq: Every day | ORAL | 0 refills | Status: DC

## 2016-11-04 NOTE — Progress Notes (Signed)
Subjective:    Debra [name] - 61 y.o. female MRN [id number]  Date of birth: [birth year]  HPI  Debra [name] is here for medication management.  Chronic HTN Disease Monitoring:  Home BP Monitoring - yes checks about 2-4 times per month, 130/70s on average  Chest pain- no  Dyspnea- no Headache - no  Medications: HCTZ 12.5 mg daily  Compliance- yes Lightheadedness- no  Edema- no   Hypothyroidism: Reports compliance with Synthroid. Tolerating medication well without side effects. No hot/cold intolerance, skin/nail/hair changes, or unintended weight changes.   Hyperlipidemia: Patient states she has cut down on fried foods and has made other positive changes in her diet. She is not wanting to start any new medication. Her last LDL was 161. Takes ASA 81 mg daily.   Anxiety: Reports anxiety for 6-7 months. Has a history of anxiety. Had been taking Xanax when anxiety was really overwhelming and couldn't get mind to calm down. Has been over a year since has had Xanax. 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 She sometimes gets overwhelmed with the tasks she has to do and has trouble focusing since she is so worried. She is very interested in therapy today and requests referral/resources. Denies SI and HI. GAD-7 score: 18. PHQ-9 score: 4.   -  reports that she has been smoking Cigarettes.  She has a 3.00 pack-year smoking history. She has never used smokeless tobacco. - Review of Systems: Per HPI. - Past Medical History: Patient Active Problem List   Diagnosis Date Noted  . Paresthesia 11/01/2015  . Essential hypertension 12/06/2014  . Rhinitis, allergic 12/06/2014  . Epidermoid cyst of neck 02/25/2014  . Subcutaneous nodule 02/25/2014  . 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 Tobacco abuse 10/23/2013  . Hypothyroidism 09/12/2013  . Overuse of medication 09/12/2013  . Fatigue 09/11/2013  . Routine health maintenance 08/10/2013  . Adductor tendinitis 10/12/2012   . Insomnia 08/03/2012  . Pure hypercholesterolemia 07/08/2012  . Strain of hip flexor 07/04/2012  . BMI 33.0-33.9,adult 07/04/2012  . H/O hematuria 07/04/2012   - Medications: reviewed and updated   Objective:   Physical Exam BP 132/80   Pulse 80   Temp 97.7 F (36.5 C) (Oral)   Wt 154 lb (69.9 kg)   SpO2 99%   BMI 25.63 kg/m  Gen: NAD, alert, cooperative with exam, well-appearing HEENT: NCAT, PERRL, clear conjunctiva, oropharynx clear, supple neck CV: RRR, good S1/S2, no murmur, no edema, capillary refill brisk  Resp: CTABL, no wheezes, non-labored Abd: SNTND, BS present, no guarding or organomegaly Skin: no rashes, normal turgor  Neuro: no gross deficits.  Psych: good insight, alert and oriented    Assessment & Plan:   1. Hypothyroidism, unspecified type Last TSH in August 2017 stable.  - TSH - levothyroxine (SYNTHROID, LEVOTHROID) 75 MCG tablet; Take 1 tablet (75 mcg total) by mouth daily.  Dispense: 90 tablet; Refill: 3  2. Hyperlipidemia, unspecified hyperlipidemia type Discussed ASCVD 10 year risk of 17.8%. Recommended statin therapy; patient declined.  - Lipid Panel  3. Essential hypertension Well controlled today.  - Basic Metabolic Panel - hydrochlorothiazide (HYDRODIURIL) 12.5 MG tablet; Take 1 tablet (12.5 mg total) by mouth daily.  Dispense: 90 tablet; Refill: 3  4. Anxiety GAD-7 elevated today. Symptoms consistent with anxiety. No safety concerns per history.  - FLUoxetine (PROZAC) 10 MG capsule; Take 1 capsule (10 mg total) by mouth daily.  Dispense: 30 capsule; Refill: 0 -seen by Sammuel Hines, CSW with [facility];  warm hand off completed; plan as follows:   1. LCSW will F/U with phone call in 3 to 5 days  2. Behavioral recommendations: relaxed breathing   3. Referral:in-network provided for BCBS  4.Patient will review list provided by [facility] and contact BCBS for a provided list.  5. Patient will schedule f./u with LCSW or [facility] is needed. -f/u in 1-2 weeks  for possible dose adjustment of medication   5. Routine Health Maintenance  -recommended mammogram  -discussed that PAP will be due in 2020 with most recent cervical cancer screening guidelines    Marcy Siren, D.O. 11/04/2016, 9:30 AM PGY-3, [facility]

## 2016-11-04 NOTE — Assessment & Plan Note (Signed)
Discussed ASCVD 10 year risk of 17.8%. Recommended statin therapy; patient declined.  - Lipid Panel

## 2016-11-04 NOTE — Assessment & Plan Note (Signed)
Well controlled today.  - Basic Metabolic Panel - hydrochlorothiazide (HYDRODIURIL) 12.5 MG tablet; Take 1 tablet (12.5 mg total) by mouth daily.  Dispense: 90 tablet; Refill: 3

## 2016-11-04 NOTE — Progress Notes (Signed)
Total time:15 minutes Type of Service: Integrated Behavioral Health warm handoff  Interpretor:No.    SUBJECTIVE: Debra [name] is a 61 y.o. female  Patient was referred by Dr. Earlene Plater for: symptoms of anxiety.  Patient reports: racing thoughts, fast heart beat and feelings of anxious.   Patient reports symptoms of anxiety for about 2 to 3 years.  She has noticed progression of getting worse and things she has previously done to manage her symptoms are no longer working. 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  No hx of therapy in the past.  GOALS:  Patient will reduce symptoms of: anxiety, , and increase knowledge and ability FK:CLEXNT skills and self-management skills,  Intervention:Reflective listening, psycho education, Behavioral Therapy (Relaxed breathing), Referral to Counselor/Psychotherapist   Issues discussed: current stressors, how managed in the past, psycho education for anxiety, new coping skills. Short-term verses long-term therapy. Reviewed list of providers. Patient has decided she would prefer long-term therapy.   ASSESSMENT:Patient currently experiencing symptoms of anxiety. Previous interventions she has used to manage these symptoms are no longer working.  Patient may benefit from, and is in agreement to receive further assessment and therapeutic interventions to assist with managing her symptoms.  LCSW offered patient to continue integrated care until she is connected with a therapist.      PLAN:  1. LCSW will F/U with phone call in 3 to 5 days 2. Behavioral recommendations: relaxed breathing  3. Referral:in-network provided for BCBS 4.Patient will review list provided by [facility] and contact [employer]BS for a provided list. 5. Patient will schedule f./u with LCSW or [facility] is needed.  Warm Hand Off Completed.     Sammuel Hines, LCSW Licensed Clinical Social Worker [facility]   [phone] 9:57 AM

## 2016-11-04 NOTE — Patient Instructions (Addendum)
I have prescribed Prozac for your anxiety. Start taking this medication daily. Please call if you have any problems. If you have any suicidal thoughts discontinue the medication immediately. Please follow up in 1-2 weeks so that we can touch base with how you are doing and increase dose if needed.   I have refilled your medications. We will call you with your lab results.     Living With Anxiety After being diagnosed with an anxiety disorder, you may be relieved to know why you have felt or behaved a certain way. It is natural to also feel overwhelmed about the treatment ahead and what it will mean for your life. With care and support, you can manage this condition and recover from it. How to cope with anxiety Dealing with stress Stress is your body's reaction to life changes and events, both good and bad. Stress can last just a few hours or it can be ongoing. Stress can play a major role in anxiety, so it is important to learn both how to cope with stress and how to think about it differently. Talk with your health care provider or a counselor to learn more about stress reduction. He or she may suggest some stress reduction techniques, such as:  Music therapy. This can include creating or listening to music that you enjoy and that inspires you.  Mindfulness-based meditation. This involves being aware of your normal breaths, rather than trying to control your breathing. It can be done while sitting or walking.  Centering prayer. This is a kind of meditation that involves focusing on a word, phrase, or sacred image that is meaningful to you and that brings you peace.  Deep breathing. To do this, expand your stomach and inhale slowly through your nose. Hold your breath for 3-5 seconds. Then exhale slowly, allowing your stomach muscles to relax.  Self-talk. This is a skill where you identify thought patterns that lead to anxiety reactions and correct those thoughts.  Muscle relaxation. This  involves tensing muscles then relaxing them.  Choose a stress reduction technique that fits your lifestyle and personality. Stress reduction techniques take time and practice. Set aside 5-15 minutes a day to do them. Therapists can offer training in these techniques. The training may be covered by some insurance plans. Other things you can do to manage stress include:  Keeping a stress diary. This can help you learn what triggers your stress and ways to control your response.  Thinking about how you respond to certain situations. You may not be able to control everything, but you can control your reaction.  Making time for activities that help you relax, and not feeling guilty about spending your time in this way.  Therapy combined with coping and stress-reduction skills provides the best chance for successful treatment. Medicines Medicines can help ease symptoms. Medicines for anxiety include:  Anti-anxiety drugs.  Antidepressants.  Beta-blockers.  Medicines may be used as the main treatment for anxiety disorder, along with therapy, or if other treatments are not working. Medicines should be prescribed by a health care provider. Relationships Relationships can play a big part in helping you recover. Try to spend more time connecting with trusted friends and family members. Consider going to couples counseling, taking family education classes, or going to family therapy. Therapy can help you and others better understand the condition. How to recognize changes in your condition Everyone has a different response to treatment for anxiety. Recovery from anxiety happens when symptoms decrease and stop interfering  with your daily activities at home or work. This may mean that you will start to:  Have better concentration and focus.  Sleep better.  Be less irritable.  Have more energy.  Have improved memory.  It is important to recognize when your condition is getting worse. Contact your  health care provider if your symptoms interfere with home or work and you do not feel like your condition is improving. Where to find help and support: You can get help and support from these sources:  Self-help groups.  Online and [employer].  A trusted spiritual leader.  Couples counseling.  Family education classes.  Family therapy.  Follow these instructions at home:  Eat a healthy diet that includes plenty of vegetables, fruits, whole grains, low-fat dairy products, and lean protein. Do not eat a lot of foods that are high in solid fats, added sugars, or salt.  Exercise. Most adults should do the following: ? Exercise for at least 150 minutes each week. The exercise should increase your heart rate and make you sweat (moderate-intensity exercise). ? Strengthening exercises at least twice a week.  Cut down on caffeine, tobacco, alcohol, and other potentially harmful substances.  Get the right amount and quality of sleep. Most adults need 7-9 hours of sleep each night.  Make choices that simplify your life.  Take over-the-counter and prescription medicines only as told by your health care provider.  Avoid caffeine, alcohol, and certain over-the-counter cold medicines. These may make you feel worse. Ask your pharmacist which medicines to avoid.  Keep all follow-up visits as told by your health care provider. This is important. Questions to ask your health care provider  Would I benefit from therapy?  How often should I follow up with a health care provider?  How long do I need to take medicine?  Are there any long-term side effects of my medicine?  Are there any alternatives to taking medicine? Contact a health care provider if:  You have a hard time staying focused or finishing daily tasks.  You spend many hours a day feeling worried about everyday life.  You become exhausted by worry.  You start to have headaches, feel tense, or have  nausea.  You urinate more than normal.  You have diarrhea. Get help right away if:  You have a racing heart and shortness of breath.  You have thoughts of hurting yourself or others. If you ever feel like you may hurt yourself or others, or have thoughts about taking your own life, get help right away. You can go to your nearest emergency department or call:  Your local emergency services (911 in the [place]  A suicide crisis helpline, such as the National Suicide Prevention Lifeline at 669-357-4910. This is open 24-hours a day.  Summary  Taking steps to deal with stress can help calm you.  Medicines cannot cure anxiety disorders, but they can help ease symptoms.  Family, friends, and partners can play a big part in helping you recover from an anxiety disorder. This information is not intended to replace advice given to you by your health care provider. Make sure you discuss any questions you have with your health care provider. Document Released: 02/25/2016 Document Revised: 02/25/2016 Document Reviewed: 02/25/2016 Elsevier Interactive Patient Education  [employer].

## 2016-11-04 NOTE — Assessment & Plan Note (Signed)
Last TSH in August 2017 stable.  - TSH - levothyroxine (SYNTHROID, LEVOTHROID) 75 MCG tablet; Take 1 tablet (75 mcg total) by mouth daily.  Dispense: 90 tablet; Refill: 3

## 2016-11-05 LAB — BASIC METABOLIC PANEL
BUN/Creatinine Ratio: 15 (ref 12–28)
BUN: 12 mg/dL (ref 8–27)
CALCIUM: 9.2 mg/dL (ref 8.7–10.3)
CO2: 23 mmol/L (ref 20–29)
CREATININE: 0.81 mg/dL (ref 0.57–1.00)
Chloride: 102 mmol/L (ref 96–106)
GFR calc Af Amer: 91 mL/min/{1.73_m2} (ref >59)
GFR, EST NON AFRICAN AMERICAN: 79 mL/min/{1.73_m2} (ref >59)
Glucose: 115 mg/dL — ABNORMAL HIGH (ref 65–99)
Potassium: 3.6 mmol/L (ref 3.5–5.2)
Sodium: 141 mmol/L (ref 134–144)

## 2016-11-05 LAB — LIPID PANEL
CHOLESTEROL TOTAL: 173 mg/dL (ref 100–199)
Chol/HDL Ratio: 3 ratio (ref 0.0–4.4)
HDL: 57 mg/dL (ref >39)
LDL Calculated: 105 mg/dL — ABNORMAL HIGH (ref 0–99)
Triglycerides: 57 mg/dL (ref 0–149)
VLDL Cholesterol Cal: 11 mg/dL (ref 5–40)

## 2016-11-05 LAB — TSH: TSH: 8.05 u[IU]/mL — AB (ref 0.450–4.500)

## 2016-11-09 ENCOUNTER — Telehealth: Payer: Self-pay | Admitting: *Deleted

## 2016-11-09 NOTE — Telephone Encounter (Signed)
Patient calls triage line, wants to let Dr. Earlene Plater know that the Prozac is not working.  She states "its giving me headaches and not doing what it is supposed to do".  Will forward to PCP. Marland KitchenFleeger, Maryjo Rochester, CMA

## 2016-11-10 ENCOUNTER — Telehealth: Payer: Self-pay | Admitting: Licensed Clinical Social Worker

## 2016-11-10 NOTE — Telephone Encounter (Signed)
LVM for pt to call office back to inform her of below. Zimmerman Rumple, Karam Dunson D, CMA  

## 2016-11-10 NOTE — Progress Notes (Signed)
Integrated Care f/u phone call to patient reference interventions discussed and resources provided during joint visit with PCP.  Left voice message to call LCSW.  Plan: LCSW will wait for return call  Sammuel Hines, LCSW Licensed Clinical Social Worker [facility]   [phone] 10:08 AM

## 2016-11-10 NOTE — Telephone Encounter (Signed)
Please have patient make an appointment for later this week as she needed follow up within 1-2 weeks anyways. She can discontinue Prozac if she feels it is causing her headaches. Headaches can be a side effect of this medication. However, I am not surprised that she has not noticed an improvement in the anxiety yet as it takes 2-6 weeks to start noticing changes in mood.   Marcy Siren, D.O. 11/10/2016, 9:19 AM PGY-3, [facility]

## 2016-11-10 NOTE — Telephone Encounter (Signed)
Contacted pt at number given to me ([phone]) by Sammuel Hines CSW.  Informed pt of below and appointment scheduled for 11/20/16 with PCP. Lamonte Sakai, April D, [place]

## 2016-11-20 ENCOUNTER — Ambulatory Visit (INDEPENDENT_AMBULATORY_CARE_PROVIDER_SITE_OTHER): Payer: BLUE CROSS/BLUE SHIELD | Admitting: Internal Medicine

## 2016-11-20 ENCOUNTER — Encounter: Payer: Self-pay | Admitting: Internal Medicine

## 2016-11-20 VITALS — BP 132/84 | HR 68 | Temp 98.1°F | Ht 65.0 in | Wt 148.6 lb

## 2016-11-20 DIAGNOSIS — G4700 Insomnia, unspecified: Secondary | ICD-10-CM

## 2016-11-20 DIAGNOSIS — F419 Anxiety disorder, unspecified: Secondary | ICD-10-CM

## 2016-11-20 DIAGNOSIS — E039 Hypothyroidism, unspecified: Secondary | ICD-10-CM

## 2016-11-20 MED ORDER — LEVOTHYROXINE SODIUM 100 MCG PO TABS: 100.0000 ug | ORAL_TABLET | Freq: Every day | ORAL | 3 refills | Status: DC

## 2016-11-20 MED ORDER — VENLAFAXINE HCL ER 75 MG PO CP24: ORAL_CAPSULE | ORAL | 0 refills | Status: DC

## 2016-11-20 MED ORDER — TRAZODONE HCL 50 MG PO TABS: 25.0000 mg | ORAL_TABLET | Freq: Every evening | ORAL | 3 refills | Status: DC | PRN

## 2016-11-20 NOTE — Assessment & Plan Note (Signed)
TSH elevated to 8 at last visit. Increase synthroid dose and return in 6 weeks for repeat TSH.  - levothyroxine (SYNTHROID, LEVOTHROID) 100 MCG tablet; Take 1 tablet (100 mcg total) by mouth daily.  Dispense: 30 tablet; Refill: 3 - TSH; Future

## 2016-11-20 NOTE — Assessment & Plan Note (Signed)
Likely related to anxiety. Patient wishes to try sleep aid medication today. Discussed sleep hygiene as well. Suspect longer periods of restful sleep would help improve mood.  - traZODone (DESYREL) 50 MG tablet; Take 0.5-1 tablets (25-50 mg total) by mouth at bedtime as needed for sleep.  Dispense: 30 tablet; Refill: 3

## 2016-11-20 NOTE — Assessment & Plan Note (Signed)
GAD-7 minimally improved from 17 to 13. Discussed option of switching to another SSRI vs. SNRI due to reported AE of headaches. Patient elected to try SNRI. Patient has a history of HTN although it has been well controlled over the past several years. Will watch BP with initiation of this medication. Patient has psychiatry appointment in the community scheduled for 10/25. I offered ICC today or in the near future and patient declined. No safety concerns presently. Follow up in 2 weeks.  - venlafaxine XR (EFFEXOR XR) 75 MG 24 hr capsule; Take 1/2 tablet for 7 days then increase to 1 tablet daily if tolerating well.  Dispense: 30 capsule; Refill: 0

## 2016-11-20 NOTE — Patient Instructions (Addendum)
Your last TSH was mildly elevated. Therefore, I have increased the dose of your Synthroid to 100 mcg. Please start this new prescription. Return for a lab visit to check your thyroid level in 6 weeks.   I have prescribed Effexor for your anxiety. Start with 1/2 tablet daily then increase to 1 tablet daily after one week if you are tolerating the medication well.

## 2016-11-20 NOTE — Progress Notes (Signed)
Subjective:    Debra [name] - 61 y.o. female MRN [id number]  Date of birth: 11/19/1955  HPI  Debra LipsCarol L [name] is here for follow up of medical conditions.  Hypothyroidism: Last TSH 2 weeks ago noted to be elevated at 8. Reports compliance with Synthroid. Tolerating medication well without side effects. No hot/cold intolerance, skin/nail/hair changes, or unintended weight changes.   Anxiety: Patient with history of anxiety previously used Xanax prn. Started on Prozac 10 mg at last office visit. Returns today for follow up and reports she has had daily headaches since initiating this medication and would like to switch. Her mood is only somewhat improved. She has not had any panic attacks but says she gets very nervous when it is time to go to work. 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 However, when she gets off work and comes home she has trouble "winding down". Feels like she has a lot of intrusive thoughts and constant worrying. States that she has trouble finding time to take care of herself. She worries a lot about her children even though they are grown. Has trouble falling asleep and is then only able to remain asleep for about 2-3 hours at a time. GAD-7 score of 13 today. She denies SI or HI. She declines ICC today.     Health Maintenance Due  Topic Date Due  . INFLUENZA VACCINE  10/14/2016    -  reports that she has been smoking Cigarettes.  She has a 3.00 pack-year smoking history. She has never used smokeless tobacco. - Review of Systems: Per HPI. - Past Medical History: Patient Active Problem List   Diagnosis Date Noted  . Anxiety 11/04/2016  . Hyperlipidemia 11/04/2016  . Paresthesia 11/01/2015  . Essential hypertension 12/06/2014  . Rhinitis, allergic 12/06/2014  . Epidermoid cyst of neck 02/25/2014  . Subcutaneous nodule 02/25/2014  . 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 Tobacco abuse 10/23/2013  . Hypothyroidism 09/12/2013  . Overuse of  medication 09/12/2013  . Fatigue 09/11/2013  . Routine health maintenance 08/10/2013  . Adductor tendinitis 10/12/2012  . Insomnia 08/03/2012  . Pure hypercholesterolemia 07/08/2012  . Strain of hip flexor 07/04/2012  . BMI 33.0-33.9,adult 07/04/2012  . H/O hematuria 07/04/2012   - Medications: reviewed and updated   Objective:   Physical Exam Ht 5\' 5"  (1.651 m)   Wt 148 lb 9.6 oz (67.4 kg)   BMI 24.73 kg/m  Gen: NAD, alert, cooperative with exam, well-appearing Psych: good insight, alert and oriented, tearful at times   Assessment & Plan:   1. Hypothyroidism, unspecified type TSH elevated to 8 at last visit. Increase synthroid dose and return in 6 weeks for repeat TSH.  - levothyroxine (SYNTHROID, LEVOTHROID) 100 MCG tablet; Take 1 tablet (100 mcg total) by mouth daily.  Dispense: 30 tablet; Refill: 3 - TSH; Future  2. Anxiety GAD-7 minimally improved from 17 to 13. Discussed option of switching to another SSRI vs. SNRI due to reported AE of headaches. Patient elected to try SNRI. Patient has a history of HTN although it has been well controlled over the past several years. Will watch BP with initiation of this medication. Patient has psychiatry appointment in the community scheduled for 10/25. I offered ICC today or in the near future and patient declined. No safety concerns presently. Follow up in 2 weeks.  - venlafaxine XR (EFFEXOR XR) 75 MG 24 hr capsule; Take 1/2 tablet for 7 days then increase  to 1 tablet daily if tolerating well.  Dispense: 30 capsule; Refill: 0  3. Insomnia, unspecified type Likely related to anxiety. Patient wishes to try sleep aid medication today. Discussed sleep hygiene as well. Suspect longer periods of restful sleep would help improve mood.  - traZODone (DESYREL) 50 MG tablet; Take 0.5-1 tablets (25-50 mg total) by mouth at bedtime as needed for sleep.  Dispense: 30 tablet; Refill: 3  Marcy Siren, D.O. 11/20/2016, 8:37 AM PGY-3, [facility]  Family Medicine

## 2016-12-04 ENCOUNTER — Other Ambulatory Visit: Payer: BLUE CROSS/BLUE SHIELD

## 2016-12-04 DIAGNOSIS — E039 Hypothyroidism, unspecified: Secondary | ICD-10-CM

## 2016-12-05 LAB — TSH: TSH: 1.22 u[IU]/mL (ref 0.450–4.500)

## 2016-12-18 ENCOUNTER — Other Ambulatory Visit: Payer: Self-pay | Admitting: Internal Medicine

## 2017-02-02 ENCOUNTER — Telehealth: Payer: Self-pay | Admitting: Licensed Clinical Social Worker

## 2017-02-02 NOTE — Progress Notes (Signed)
Type of Service: Clinical Social Work  LCSW returned phone call from patient, states she has an appointment with PCP 02/22/17 and would like to schedule an integrated care appointment with LCSW.   Informed patient LCSW did not work on Monday, however [facility]  Patient agrees to meet with [facility]  Dec. 10th  Plan: [facility].  [facility]basket [facility] as [facility] [place].  Sammuel Hineseborah Nicholai Willette, LCSW Licensed Clinical Social Worker [facility]   [phone] 2:39 PM

## 2017-02-22 ENCOUNTER — Ambulatory Visit: Payer: BLUE CROSS/BLUE SHIELD | Admitting: Internal Medicine

## 2017-02-22 ENCOUNTER — Ambulatory Visit: Payer: BLUE CROSS/BLUE SHIELD

## 2017-02-25 ENCOUNTER — Encounter: Payer: Self-pay | Admitting: *Deleted

## 2017-02-25 ENCOUNTER — Encounter: Payer: Self-pay | Admitting: Internal Medicine

## 2017-02-25 ENCOUNTER — Ambulatory Visit (INDEPENDENT_AMBULATORY_CARE_PROVIDER_SITE_OTHER): Payer: BLUE CROSS/BLUE SHIELD | Admitting: Licensed Clinical Social Worker

## 2017-02-25 ENCOUNTER — Ambulatory Visit: Payer: BLUE CROSS/BLUE SHIELD | Admitting: Internal Medicine

## 2017-02-25 ENCOUNTER — Other Ambulatory Visit: Payer: Self-pay

## 2017-02-25 VITALS — BP 120/78 | HR 88 | Temp 98.7°F | Ht 65.0 in | Wt 150.0 lb

## 2017-02-25 DIAGNOSIS — F32A Depression, unspecified: Secondary | ICD-10-CM

## 2017-02-25 DIAGNOSIS — G4700 Insomnia, unspecified: Secondary | ICD-10-CM

## 2017-02-25 DIAGNOSIS — F419 Anxiety disorder, unspecified: Secondary | ICD-10-CM

## 2017-02-25 DIAGNOSIS — F329 Major depressive disorder, single episode, unspecified: Secondary | ICD-10-CM

## 2017-02-25 DIAGNOSIS — Z23 Encounter for immunization: Secondary | ICD-10-CM

## 2017-02-25 MED ORDER — PAROXETINE HCL 20 MG PO TABS: 20.0000 mg | ORAL_TABLET | Freq: Every day | ORAL | 0 refills | Status: DC

## 2017-02-25 MED ORDER — ZOLPIDEM TARTRATE 5 MG PO TABS: 5.0000 mg | ORAL_TABLET | Freq: Every evening | ORAL | 0 refills | Status: DC | PRN

## 2017-02-25 NOTE — Progress Notes (Signed)
Subjective:    Debra [name] - 61 y.o. female MRN [id number]  Date of birth: 05/18/1955  HPI  Debra LipsCarol L [name] with PMH of anxiety is here for insomnia. Patient was prescribed Trazodone a few months ago for insomnia. She reports that it is not helping her sleep. Typical day has patient arriving home from work around 11 pm. "Unwinds" for a couple of hours with the TV. Falls asleep around 1-2 am and then wakes up at 6 am. She says that she is also taking someone else's medication to help her sleep at night. She is unable to recall the exact name of this medication but it sounds similar to Clonidine or Klonopin. When asked about her stress, says levels are "sky high". Has been prescribed Prozac earlier this year but this was discontinued due to headaches. She was then prescribed Effexor. She does not remember this medication but upon further questioning says she thinks Effexor made her feel like "a waterfall trickling down". Due to this feeling, she stopped the medication. PHQ-9 score of 10 and GAD-7 score of 15. She denies SI or plans to harm herself.    -  reports that she has been smoking cigarettes.  She has a 3.00 pack-year smoking history. she has never used smokeless tobacco. - Review of Systems: Per HPI. - Past Medical History: Patient Active Problem List   Diagnosis Date Noted  . Anxiety 11/04/2016  . Hyperlipidemia 11/04/2016  . Paresthesia 11/01/2015  . Essential hypertension 12/06/2014  . Rhinitis, allergic 12/06/2014  . Tobacco abuse 10/23/2013  . Hypothyroidism 09/12/2013  . Fatigue 09/11/2013  . Insomnia 08/03/2012   - Medications: reviewed and updated   Objective:   Physical Exam BP 120/78   Pulse 88   Temp 98.7 F (37.1 C) (Oral)   Ht 5\' 5"  (1.651 m)   Wt 150 lb (68 kg)   SpO2 98%   BMI 24.96 kg/m  Gen: NAD, alert, cooperative with exam, well-appearing Psych: good insight, alert and oriented     Assessment & Plan:   1. Anxiety and depression Uncontrolled today  as evidenced by elevated GAD-7 and PHQ-9 scores. Patient not a safety risk to herself so good candidate for continued outpatient therapy. Have prescribed a SSRI and SNRI in the recent past that patient has not been compliant due to adverse effects. Will attempt medical therapy with Paxil 20 mg. Encouraged patient to give at least a 6 week trial of this medication without discontinuation. Discussed that combination therapy with medication and psychotherapy are the best treatments for anxiety and depression. Patient seen by Sammuel Hineseborah Moore, CSW today. Please see her note for further details. Patient to follow up in 1-2 weeks with PCP and with Sammuel Hineseborah Moore in 3 weeks.  - PARoxetine (PAXIL) 20 MG tablet; Take 1 tablet (20 mg total) by mouth daily.  Dispense: 30 tablet; Refill: 0  2. Insomnia, unspecified type Suspect insomnia is mostly related to uncontrolled mood disorders. Discussed this with patient. Prescribed short term course of Ambien in an effort for patient to get more restful sleep as we work to get anxiety symptoms under control. Reiterated several times that Ambien is not a long term solution to her sleep problems and that I will not refill. Discussed sleep hygiene measures. Sleep pattern complicated by odd work hours and odd bedtime.  - zolpidem (AMBIEN) 5 MG tablet; Take 1 tablet (5 mg total) by mouth at bedtime as needed for sleep.  Dispense: 30 tablet; Refill: 0  3.  Need for immunization against influenza - Flu Vaccine QUAD 36+ mos IM   Marcy Sirenatherine Analeah Brame, D.O. 02/25/2017, 10:30 AM PGY-3, [facility]

## 2017-02-25 NOTE — Patient Instructions (Signed)
Start taking the Paroxetine 20 mg daily for anxiety and depression. I have prescribed Ambien as a short term medication to help you get some sleep. This medication is not a long term solution. I think getting your stress and anxiety under control is the long term solution to sleeping better.   What can I do to improve my insomnia?-You can follow good "sleep hygiene." That means that you: ?Sleep only long enough to feel rested and then get out of bed ?Go to bed and get up at the same time every day ?Do not try to force yourself to sleep. If you can't sleep, get out of bed and try again later. ?Have coffee, tea, and other foods that have caffeine only in the morning ?Avoid alcohol in the late afternoon, evening, and bedtime ?Avoid smoking, especially in the evening ?Keep your bedroom dark, cool, quiet, and free of reminders of work or other things that cause you stress ?Solve problems you have before you go to bed ?Exercise several days a week, but not right before bed ?Avoid looking at phones or reading devices ("e-books") that give off light before bed. This can make it harder to fall asleep. Other things that can improve sleep include: ?Relaxation therapy, in which you focus on relaxing all the muscles in your body 1 by 1 ?Working with a Conservator, museum/gallerycounselor or psychologist to deal with the problems that might be causing poor sleep

## 2017-02-25 NOTE — Progress Notes (Signed)
Type of Service: Integrated [facility] 1st F/U Visit Total time:35 minutes :  Interpretor:No.    Reason for follow-up: Continue brief intervention to assist patient with managing symptoms of anxiety and depression, as well as relationship stressors . Reports  Very difficult with  impairment in daily functions. Patient is pleasant and engaged in conversation.   Appearance:Neat ; Thought process: Coherent; Affect: Appropriate and Tearful at times; No SI orNo plan to harm self or others  PHQ 9=10, indication of: moderate depression. GAD-7=15, indication of: severe anxiety.   GOALS: Patient will reduce symptoms of: anxiety, depression and stress , and increase  ability JX:[id number]:coping skills, self-management skills and stress reduction, .  Intervention: Sleep Hygiene, Reflective listening, Behavioral Therapy (Relaxed breathing), Psychoeducation   Issues discussed: reviewed [facility]  ; demonstration of relaxed breathing ;  Reviewed barriers to obtaining medication; reviewed sleep hygiend ; relationship stressors. Children /grandchildren stress and "no plan for my life". ASSESSMENT:Patient continues to experience symptoms of depression and anxiety.  Symptoms exacerbated by relationship stressors and patient feeling like she has no plan for her life.  Patient continues to work 2nd shift and enjoys her job. Patient is concerned about her co-pay for ongoing therapy and does not think she will go. Patient may benefit from, and is in agreement to continue further assessment and brief therapeutic interventions to assist with managing her symptoms.  PLAN: 1. Patient will F/U with LCSW in 3 weeks 2. Behavioral recommendations: relaxed breathing, and sleep hygiene 3. Referral:[facility] with [facility] 4. Patient will start taking Ambien and Paroxetine as prescribed by PCP.  Sammuel Hineseborah Andrea Ferrer, LCSW Licensed Clinical Social Worker [facility]    [id number] 10:36 AM

## 2017-03-02 ENCOUNTER — Ambulatory Visit: Payer: BLUE CROSS/BLUE SHIELD | Admitting: Internal Medicine

## 2017-03-15 ENCOUNTER — Other Ambulatory Visit: Payer: Self-pay | Admitting: Internal Medicine

## 2017-03-15 DIAGNOSIS — E039 Hypothyroidism, unspecified: Secondary | ICD-10-CM

## 2017-03-18 ENCOUNTER — Ambulatory Visit: Payer: BLUE CROSS/BLUE SHIELD

## 2017-03-19 ENCOUNTER — Other Ambulatory Visit: Payer: Self-pay | Admitting: Internal Medicine

## 2017-04-15 ENCOUNTER — Other Ambulatory Visit: Payer: Self-pay | Admitting: Internal Medicine

## 2017-04-15 DIAGNOSIS — E039 Hypothyroidism, unspecified: Secondary | ICD-10-CM

## 2017-04-29 ENCOUNTER — Ambulatory Visit
Admission: RE | Admit: 2017-04-29 | Discharge: 2017-04-29 | Disposition: A | Discharge status: Home / Self Care | Payer: BLUE CROSS/BLUE SHIELD | Source: Ambulatory Visit | Attending: Family Medicine | Admitting: Family Medicine

## 2017-04-29 ENCOUNTER — Other Ambulatory Visit: Payer: Self-pay

## 2017-04-29 ENCOUNTER — Encounter: Payer: Self-pay | Admitting: Internal Medicine

## 2017-04-29 ENCOUNTER — Ambulatory Visit (INDEPENDENT_AMBULATORY_CARE_PROVIDER_SITE_OTHER): Payer: BLUE CROSS/BLUE SHIELD | Admitting: Internal Medicine

## 2017-04-29 VITALS — BP 114/74 | HR 71 | Temp 97.8°F | Ht 65.0 in | Wt 149.0 lb

## 2017-04-29 DIAGNOSIS — G8929 Other chronic pain: Secondary | ICD-10-CM | POA: Diagnosis not present

## 2017-04-29 DIAGNOSIS — M5441 Lumbago with sciatica, right side: Secondary | ICD-10-CM | POA: Diagnosis not present

## 2017-04-29 DIAGNOSIS — R200 Anesthesia of skin: Secondary | ICD-10-CM | POA: Diagnosis not present

## 2017-04-29 DIAGNOSIS — I10 Essential (primary) hypertension: Secondary | ICD-10-CM

## 2017-04-29 MED ORDER — MELOXICAM 15 MG PO TABS: 15.0000 mg | ORAL_TABLET | Freq: Every day | ORAL | 1 refills | Status: DC

## 2017-04-29 MED ORDER — GABAPENTIN 100 MG PO CAPS: 100.0000 mg | ORAL_CAPSULE | Freq: Three times a day (TID) | ORAL | 0 refills | Status: DC

## 2017-04-29 NOTE — Patient Instructions (Signed)
We will order an MRI of your brain to see if you have had a stroke. We will also order an xray of your back.   I have prescribed Meloxicam and Gabapentin for your pain. Gabapentin can be sedating the first time you take it.   If you have worsening of your numbness, facial droop, slurred speech, weakness of an arm and/or leg, or confusion go to the ER immediately.

## 2017-04-29 NOTE — Progress Notes (Signed)
Subjective:    Debra [name] - 62 y.o. female MRN [id number]  Date of birth: [birth year]  HPI  Debra [name] is here for numbness and back pain.  Numbness: Has been present for approximately 8 days. Occurred suddenly on the right side of her face and left side of her body both in UE and LE. She denies weakness of extremities. No facial droop, changes in speech, confusion, headaches or fevers. She has a remote history of numbness in her toes bilaterally. No known history of CVA/TIA.   Back Pain: Right sided. Starts in lower back and radiates around towards hip and down back of leg often stopping at the knee. Has been present intermittently for years. Usually resolves after a short period of time with NSAIDs. She has been taking ibuprofen without significant relief and this has been present for about 3-4 weeks. She denies injury or trauma. 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 She denies changes in bowel or bladder. No saddle anesthesia.   -  reports that she has been smoking cigarettes.  She has a 3.00 pack-year smoking history. she has never used smokeless tobacco. - Review of Systems: Per HPI. - Past Medical History: Patient Active Problem List   Diagnosis Date Noted  . Anxiety 11/04/2016  . Hyperlipidemia 11/04/2016  . Paresthesia 11/01/2015  . Essential hypertension 12/06/2014  . Rhinitis, allergic 12/06/2014  . Tobacco abuse 10/23/2013  . Hypothyroidism 09/12/2013  . Fatigue 09/11/2013  . Insomnia 08/03/2012   - Medications: reviewed and updated   Objective:   Physical Exam BP 114/74   Pulse 71   Temp 97.8 F (36.6 C) (Oral)   Ht 5\' 5"  (1.651 m)   Wt 149 lb (67.6 kg)   SpO2 99%   BMI 24.79 kg/m  Gen: NAD, alert, cooperative with exam, well-appearing CV: RRR, good S1/S2, no murmur, no edema, capillary refill brisk  Resp: CTABL, no wheezes, non-labored MSK: Good ROM at all extremities. TTP over right  paraspinal lumbar muscles.  Neuro: Sensation diminished to right side of face otherwise CN II-XII grossly intact. Strength 5/5 in all extremities. Sensation diminished to left UE and left LE. Gait normal. Speech clear. Alert and oriented. Normal finger to nose testing.     Assessment & Plan:   1. Numbness Differential remains broad. Consider CVA vs. MS vs. Nerve impingement vs. Hypothyroid neuropathy vs. Vitamin deficiency. Patient has risk factors for CVA (HTN, hyperlipidemia, tobacco use) so will obtain MRI to evaluate. If CVA occurred, focus would be secondary prevention. Could consider further imaging of spine to evaluate for MS; however, patient is older than typical presentation for MS. Less likely hypothyroid neuropathy given typically fairly well controlled TSH on Synthroid and lack of bilateral, equal presentation.  - MR Brain W Wo Contrast; Future  2. Chronic right-sided low back pain with right-sided sciatica Given chronicity of pain and intermittent nature, will start with lumbar x-ray. Treat with Mobic, Gabapentin, heat/ice. If persists, will likely refer to PT. If no resolution or worsening of symptoms, could consider MRI of lumbar spine.  - DG Lumbar Spine Complete; Future - meloxicam (MOBIC) 15 MG tablet; Take 1 tablet (15 mg total) by mouth daily.  Dispense: 30 tablet; Refill: 1 - gabapentin (NEURONTIN) 100 MG capsule; Take 1 capsule (100 mg total) by mouth 3 (three) times daily.  Dispense: 90 capsule; Refill: 0   Marcy Siren, D.O. 05/03/2017, 1:23 PM PGY-3, [facility]   Patient precepted with Dr. Jennette KettleNeal who helped to develop the above assessment and plan.

## 2017-04-30 LAB — BASIC METABOLIC PANEL
BUN / CREAT RATIO: 14 (ref 12–28)
BUN: 12 mg/dL (ref 8–27)
CHLORIDE: 105 mmol/L (ref 96–106)
CO2: 22 mmol/L (ref 20–29)
Calcium: 9.8 mg/dL (ref 8.7–10.3)
Creatinine, Ser: 0.86 mg/dL (ref 0.57–1.00)
GFR calc non Af Amer: 73 mL/min/{1.73_m2} (ref >59)
GFR, EST AFRICAN AMERICAN: 84 mL/min/{1.73_m2} (ref >59)
GLUCOSE: 87 mg/dL (ref 65–99)
Potassium: 4.5 mmol/L (ref 3.5–5.2)
SODIUM: 142 mmol/L (ref 134–144)

## 2017-05-05 ENCOUNTER — Ambulatory Visit (HOSPITAL_COMMUNITY)
Admission: RE | Admit: 2017-05-05 | Discharge: 2017-05-05 | Disposition: A | Discharge status: Home / Self Care | Payer: BLUE CROSS/BLUE SHIELD | Source: Ambulatory Visit | Attending: Family Medicine | Admitting: Family Medicine

## 2017-05-05 DIAGNOSIS — R200 Anesthesia of skin: Secondary | ICD-10-CM | POA: Insufficient documentation

## 2017-05-05 MED ORDER — GADOBENATE DIMEGLUMINE 529 MG/ML IV SOLN
15.0000 mL | Freq: Once | INTRAVENOUS | Status: AC | PRN
  Administered 2017-05-05: 14 mL via INTRAVENOUS

## 2017-05-13 ENCOUNTER — Telehealth: Payer: Self-pay

## 2017-05-13 NOTE — Telephone Encounter (Signed)
Patient left message on nurse line asking for test results from 04/28/17. Ples SpecterAlisa Mycah Formica, RN [facility] RN)

## 2017-05-18 NOTE — Telephone Encounter (Signed)
I would like patient to come in for an appointment to discuss her results.   Debra [name], D.O. 05/18/2017, 12:13 PM PGY-3, [facility]

## 2017-05-18 NOTE — Telephone Encounter (Signed)
PCLM for pt to call the office. If pt calls, please schedule an appt with Dr. Earlene PlaterWallace for MRI results. Sunday SpillersSharon T Lety Cullens, CMA

## 2017-05-19 NOTE — Telephone Encounter (Signed)
LM with someone telling them to please have pt call office back to give her the below information and to assist in scheduling an appointment for her to discuss these results per Dr. Earlene PlaterWallace. Lamonte SakaiZimmerman Rumple, April D, [place]

## 2017-05-20 NOTE — Telephone Encounter (Signed)
PCLM for pt to return call. Sunday SpillersSharon T Sharnell Knight, CMA

## 2017-05-20 NOTE — Telephone Encounter (Signed)
Pt called, [facility]. Can be reached at [phone] Shawna OrleansMeredith B Riannah Stagner, RN

## 2017-05-21 NOTE — Telephone Encounter (Signed)
Spoke to pt. Made pt an appt for 3/22 @ 9:30. This was the first available. Sunday SpillersSharon T Mehr Depaoli, CMA

## 2017-05-21 NOTE — Telephone Encounter (Signed)
Pt called to speak with Melvenia BeamShari. States to please call her before 1:30 [phone] Shawna OrleansMeredith B Mohammed Mcandrew, RN

## 2017-06-04 ENCOUNTER — Ambulatory Visit (INDEPENDENT_AMBULATORY_CARE_PROVIDER_SITE_OTHER): Payer: BLUE CROSS/BLUE SHIELD | Admitting: Internal Medicine

## 2017-06-04 ENCOUNTER — Encounter: Payer: Self-pay | Admitting: Internal Medicine

## 2017-06-04 ENCOUNTER — Other Ambulatory Visit: Payer: Self-pay

## 2017-06-04 VITALS — BP 158/96 | HR 81 | Temp 98.1°F | Ht 65.0 in | Wt 150.2 lb

## 2017-06-04 DIAGNOSIS — M545 Low back pain, unspecified: Secondary | ICD-10-CM

## 2017-06-04 DIAGNOSIS — E039 Hypothyroidism, unspecified: Secondary | ICD-10-CM | POA: Diagnosis not present

## 2017-06-04 DIAGNOSIS — I639 Cerebral infarction, unspecified: Secondary | ICD-10-CM | POA: Diagnosis not present

## 2017-06-04 MED ORDER — KETOROLAC TROMETHAMINE 60 MG/2ML IM SOLN
60.0000 mg | Freq: Once | INTRAMUSCULAR | Status: AC
  Administered 2017-06-04: 60 mg via INTRAMUSCULAR

## 2017-06-04 MED ORDER — ATORVASTATIN CALCIUM 80 MG PO TABS
80.0000 mg | ORAL_TABLET | Freq: Every day | ORAL | 3 refills | Status: DC
Start: 2017-06-04 — End: 2018-05-25

## 2017-06-04 NOTE — Patient Instructions (Addendum)
Thank you for coming in today Ms. Debra [name]. It is always good to see you!   Your MRI showed a small stroke. I am happy to hear that you are no longer having the numbness. This likely means you will not have any long term deficits from the stroke. It is now important to minimize your risk of having another stroke. Things that can help lower your risk of stroke: keeping your blood pressure under control, continuing to take your aspirin, quitting smoking, exercise. I have also prescribed Lipitor which lowers your cholesterol and helps lower your risk of another stroke. We will also get ultrasound of the vessels in your neck to look for blockage.   Your lumbar x-ray showed osteoarthritis. We gave you a anti-inflammatory shot today for pain. I have placed a referral for physical therapy. This has the best success at controlling low back pain related to arthritis.    We will check your thyroid lab today and I will call you if we need to adjust your medication.

## 2017-06-04 NOTE — Progress Notes (Signed)
Subjective:    Debra [name] - 62 y.o. female MRN [id number]  Date of birth: 10/13/1955  HPI  Debra LipsCarol L [name] is here for follow up of imaging results.  Numbness:  [facility] >1 week of right sided facial and left LE and UE numbness. Due to concern for CVA, a MRI brain was obtained. MRI on 2/20 showed tiny cortical infarction on the right probably affecting the precentral gyrus. This was read as either late subacute or old due to no restricted diffusion or contrast enhancement. Patient reports numbness has now resolved.   Back Pain: Chronic, right sided with radiation down the back of the leg to the knee. Reports that pain continues to occur intermittently. No changes in bowel/bladder, no lower extremity numbness or weakness, no fevers.  Lumbar X-ray was obtained on 2/14 that showed  Diffuse multilevel degenerative change and mild diffuse osteopenia. Patient requests steroid injection today for pain.   Hypothyroidism: Reports compliance with Synthroid. No fatigue, changes in hair/nails, hot/cold intolerance, or unintended weight changes.   -  reports that she has been smoking cigarettes.  She has a 3.00 pack-year smoking history. She has never used smokeless tobacco. - Review of Systems: Per HPI. - Past Medical History: Patient Active Problem List   Diagnosis Date Noted  . Anxiety 11/04/2016  . Hyperlipidemia 11/04/2016  . Paresthesia 11/01/2015  . Essential hypertension 12/06/2014  . Rhinitis, allergic 12/06/2014  . Tobacco abuse 10/23/2013  . Hypothyroidism 09/12/2013  . Fatigue 09/11/2013  . Insomnia 08/03/2012   - Medications: reviewed and updated   Objective:   Physical Exam BP (!) 158/96   Pulse 81   Temp 98.1 F (36.7 C) (Oral)   Ht 5\' 5"  (1.651 m)   Wt 150 lb 3.2 oz (68.1 kg)   SpO2 99%   BMI 24.99 kg/m  Gen: NAD, alert, cooperative with exam, well-appearing CV: RRR, good S1/S2, no murmur, no edema, capillary refill brisk  Resp: CTABL, no  wheezes, non-labored MSK: Good ROM at all extremities. TTP over right paraspinal lumbar muscles.  Neuro: CN II-XII grossly intact. Strength 5/5 in all extremities. Sensation intact to all extremities. Gait normal. Speech clear. Alert and oriented. Normal finger to nose testing.     Assessment & Plan:   1. Hypothyroidism, unspecified type Last TSH normal at 1.2 in Sept 2018. Will check TSH today. Continue Synthroid.  - TSH  2. Cerebrovascular accident (CVA), unspecified mechanism (HCC) MRI shows evidence of CVA. May be read as subacute given that numbness had been present for >3 weeks at time of imaging. Had previously discussed statin therapy with patient due to elevated ASCVD risk score. She declined in fall of 2018. Had the conversation again with the framework of secondary prevention being our goal. She was agreeable to initiation of high intensity statin therapy. Continue ASA therapy. Discussed that given that numbness has now resolved do not anticipate any long term deficits. Will obtain carotid ultrasound per radiology's recommendation looking for abnormality that would predispose to embolic disease.  - atorvastatin (LIPITOR) 80 MG tablet; Take 1 tablet (80 mg total) by mouth daily.  Dispense: 90 tablet; Refill: 3 - VAS US CAROTID; Future  3. Lumbar back pain Discussed with patient finding of OA on x-ray and nature of this disease. Recommended PT and referral placed today. Discussed that steroid injections are usually not beneficial for OA but offered anti-inflammatory injection in place. Toradol given today.  - Ambulatory referral to Physical Therapy -  ketorolac (TORADOL) injection 60 mg   Marcy Siren, D.O. 06/04/2017, 9:36 AM PGY-3, [facility]

## 2017-06-05 LAB — TSH: TSH: 4.72 u[IU]/mL — ABNORMAL HIGH (ref 0.450–4.500)

## 2017-06-07 ENCOUNTER — Ambulatory Visit (HOSPITAL_COMMUNITY): Admission: RE | Admit: 2017-06-07 | Payer: BLUE CROSS/BLUE SHIELD | Source: Ambulatory Visit

## 2017-06-07 ENCOUNTER — Other Ambulatory Visit: Payer: Self-pay | Admitting: Internal Medicine

## 2017-06-07 DIAGNOSIS — E039 Hypothyroidism, unspecified: Secondary | ICD-10-CM

## 2017-06-07 MED ORDER — LEVOTHYROXINE SODIUM 112 MCG PO TABS: 112.0000 ug | ORAL_TABLET | Freq: Every day | ORAL | 3 refills | Status: DC

## 2017-06-07 NOTE — Progress Notes (Signed)
TSH mildly elevated at 4.7. I have increased her thyroid medication from 100 mcg to 112 mcg. She should return for lab appointment in 6 weeks for repeat TSH. Please call patient with this information.   Marcy Sirenatherine Wallace, D.O. 06/07/2017, 9:25 AM PGY-3, [facility]

## 2017-06-07 NOTE — Progress Notes (Signed)
LMOVM for pt to return call. Haely Leyland Dawn, CMA  

## 2017-06-08 ENCOUNTER — Ambulatory Visit (INDEPENDENT_AMBULATORY_CARE_PROVIDER_SITE_OTHER): Payer: BLUE CROSS/BLUE SHIELD | Admitting: Family Medicine

## 2017-06-08 ENCOUNTER — Telehealth: Payer: Self-pay

## 2017-06-08 VITALS — BP 138/84 | HR 86 | Temp 98.2°F | Wt 150.0 lb

## 2017-06-08 DIAGNOSIS — B029 Zoster without complications: Secondary | ICD-10-CM

## 2017-06-08 MED ORDER — HYDROCODONE-ACETAMINOPHEN 5-325 MG PO TABS: 1.0000 | ORAL_TABLET | Freq: Four times a day (QID) | ORAL | 0 refills | Status: DC | PRN

## 2017-06-08 MED ORDER — VALACYCLOVIR HCL 1 G PO TABS: 1000.0000 mg | ORAL_TABLET | Freq: Three times a day (TID) | ORAL | 0 refills | Status: DC

## 2017-06-08 NOTE — Telephone Encounter (Signed)
Pt called nurse line states she received Toradol injection Friday. Having pain, redness and swelling at injection site. Wanting to be seen. Pt scheduled on overflow. Shawna OrleansMeredith B Thomsen, RN

## 2017-06-08 NOTE — Progress Notes (Signed)
Date of Visit: 06/08/2017   HPI:  Patient presents for a same day appointment to discuss a reaction to injection.  Was seen on 3/22 by Dr. Earlene PlaterWallace and received toradol injection for back pain in her R gluteal muscle. Since then has had severe pain in her right lower back wrapping around to the right side on the front. Also thinks thigh is swollen. Has never had toradol injection in the past. Area is very sore and she's worried it's a reaction to the injection.  ROS: See HPI  PMFSH: history of hypothyroidism, hypertension, hyperlipidemia, tobacco abuse, anxiety  PHYSICAL EXAM: BP 138/84 (BP Location: Left Arm)   Pulse 86   Temp 98.2 F (36.8 C) (Oral)   Wt 150 lb (68 kg)   SpO2 99%   BMI 24.96 kg/m  Gen: no acute distress, pleasant, cooperative, well appearing Skin: erythematous grouped vesicles in a linear distribution on right lower back which does not cross the midline. Injection site still with bandaid overlying it, removal of bandaid shows no erythema, warmth, tenderness, or induration at the site of the injection. See photo below. Extremities: no appreciable swelling of R leg.      ASSESSMENT/PLAN:  1. Shingles - rash found on exam is classic for shingles. Suspect this is unrelated to toradol injection received on 3/22. - patient given handout on shingles  - note written for work for today and tomorrow, back on Thursday - start valtrex suppression - rx for hydrocodone limited supply given due to significant pain, counseled will not be long term - follow up if not improving  FOLLOW UP: Follow up as needed if symptoms worsen or fail to improve.    GrenadaBrittany J. Pollie MeyerMcIntyre, MD [facility]

## 2017-06-08 NOTE — Patient Instructions (Addendum)
I think you have shingles Prescribed pain medication and antiviral medication.  See handout below Avoid exposing others to this area  Be well, Dr. Pollie MeyerMcIntyre    Shingles Shingles, which is also known as herpes zoster, is an infection that causes a painful skin rash and fluid-filled blisters. Shingles is not related to genital herpes, which is a sexually transmitted infection. Shingles only develops in people who:  Have had chickenpox.  Have received the chickenpox vaccine. (This is rare.)  What are the causes? Shingles is caused by varicella-zoster virus (VZV). This is the same virus that causes chickenpox. After exposure to VZV, the virus stays in the body in an inactive (dormant) state. Shingles develops if the virus reactivates. This can happen many years after the initial exposure to VZV. It is not known what causes this virus to reactivate. What increases the risk? People who have had chickenpox or received the chickenpox vaccine are at risk for shingles. Infection is more common in people who:  Are older than age 62.  Have a weakened defense (immune) system, such as those with HIV, AIDS, or cancer.  Are taking medicines that weaken the immune system, such as transplant medicines.  Are under great stress.  What are the signs or symptoms? Early symptoms of this condition include itching, tingling, and pain in an area on your skin. Pain may be described as burning, stabbing, or throbbing. A few days or weeks after symptoms start, a painful red rash appears, usually on one side of the body in a bandlike or beltlike pattern. The rash eventually turns into fluid-filled blisters that break open, scab over, and dry up in about 2-3 weeks. At any time during the infection, you may also develop:  A fever.  Chills.  A headache.  An upset stomach.  How is this diagnosed? This condition is diagnosed with a skin exam. Sometimes, skin or fluid samples are taken from the blisters  before a diagnosis is made. These samples are examined under a microscope or sent to a lab for testing. How is this treated? There is no specific cure for this condition. Your health care provider will probably prescribe medicines to help you manage pain, recover more quickly, and avoid long-term problems. Medicines may include:  Antiviral drugs.  Anti-inflammatory drugs.  Pain medicines.  If the area involved is on your face, you may be referred to a specialist, such as an eye doctor (ophthalmologist) or an ear, nose, and throat (ENT) doctor to help you avoid eye problems, chronic pain, or disability. Follow these instructions at home: Medicines  Take medicines only as directed by your health care provider.  Apply an anti-itch or numbing cream to the affected area as directed by your health care provider. Blister and Rash Care  Take a cool bath or apply cool compresses to the area of the rash or blisters as directed by your health care provider. This may help with pain and itching.  Keep your rash covered with a loose bandage (dressing). Wear loose-fitting clothing to help ease the pain of material rubbing against the rash.  Keep your rash and blisters clean with mild soap and cool water or as directed by your health care provider.  Check your rash every day for signs of infection. These include redness, swelling, and pain that lasts or increases.  Do not pick your blisters.  Do not scratch your rash. General instructions  Rest as directed by your health care provider.  Keep all follow-up visits as directed  by your health care provider. This is important.  Until your blisters scab over, your infection can cause chickenpox in people who have never had it or been vaccinated against it. To prevent this from happening, avoid contact with other people, especially: ? Babies. ? Pregnant women. ? Children who have eczema. ? Elderly people who have transplants. ? People who have  chronic illnesses, such as leukemia or AIDS. Contact a health care provider if:  Your pain is not relieved with prescribed medicines.  Your pain does not get better after the rash heals.  Your rash looks infected. Signs of infection include redness, swelling, and pain that lasts or increases. Get help right away if:  The rash is on your face or nose.  You have facial pain, pain around your eye area, or loss of feeling on one side of your face.  You have ear pain or you have ringing in your ear.  You have loss of taste.  Your condition gets worse. This information is not intended to replace advice given to you by your health care provider. Make sure you discuss any questions you have with your health care provider. Document Released: 03/02/2005 Document Revised: 10/27/2015 Document Reviewed: 01/11/2014 Elsevier Interactive Patient Education  2018 [employer].

## 2017-06-11 NOTE — Progress Notes (Signed)
Pt informed and will call back for appt. Fleeger, Maryjo RochesterJessica Dawn, CMA

## 2017-07-23 ENCOUNTER — Ambulatory Visit (INDEPENDENT_AMBULATORY_CARE_PROVIDER_SITE_OTHER): Payer: BLUE CROSS/BLUE SHIELD | Admitting: Family Medicine

## 2017-07-23 ENCOUNTER — Encounter: Payer: Self-pay | Admitting: Family Medicine

## 2017-07-23 ENCOUNTER — Other Ambulatory Visit: Payer: Self-pay

## 2017-07-23 VITALS — BP 158/85 | HR 86 | Temp 98.3°F | Wt 146.0 lb

## 2017-07-23 DIAGNOSIS — M7918 Myalgia, other site: Secondary | ICD-10-CM | POA: Diagnosis not present

## 2017-07-23 MED ORDER — KETOROLAC TROMETHAMINE 30 MG/ML IJ SOLN
30.0000 mg | Freq: Once | INTRAMUSCULAR | Status: AC
  Administered 2017-07-23: 30 mg via INTRAMUSCULAR

## 2017-07-23 NOTE — Patient Instructions (Signed)
Thank you for coming in today, it was so nice to see you! Today we talked about:    I believe you have a piriformis muscle strain: We have given you a Toradol injection today which will jumpstart your pain control and calm down any inflammation.  Starting on Sunday you can take 600 mg of ibuprofen 3 times a day.  Make sure you take this medicine with a full glass of water so it does not irritate your stomach  If you continue to have significant pain even after 1 month, please schedule an appointment so we can see you again   If you have any questions or concerns, please do not hesitate to call the office at [phone]. You can also message me directly via MyChart.   Sincerely,   Anders Simmonds, MD   Piriformis Syndrome Piriformis syndrome is a condition that can cause pain and numbness in your buttocks and down the back of your leg. Piriformis syndrome happens when the small muscle that connects the base of your spine to your hip (piriformis muscle) presses on the nerve that runs down the back of your leg (sciatic nerve). The piriformis muscle helps your hip rotate and helps to bring your leg back and out. It also helps shift your weight while you are walking to keep you stable. The sciatic nerve runs under or through the piriformis. Damage to the piriformis muscle can cause spasms that put pressure on the nerve below. This causes pain and discomfort while sitting and moving. The pain may feel as if it begins in the buttock and spreads (radiates) down your hip and thigh. What are the causes? This condition is caused by pressure on the sciatic nerve from the piriformis muscle. The piriformis muscle can get irritated with overuse, especially if other hip muscles are weak and the piriformis has to do extra work. Piriformis syndrome can also occur after an injury, like a fall onto your buttocks. What increases the risk? This condition is more likely to develop in:  Women.  People who sit  for long periods of time.  Cyclists.  People who have weak buttocks muscles (gluteal muscles).  What are the signs or symptoms? Pain, tingling, or numbness that starts in the buttock and runs down the back of your leg (sciatica) is the most common symptom of this condition. Your symptoms may:  Get worse the longer you sit.  Get worse when you walk, run, or go up on stairs.  How is this diagnosed? This condition is diagnosed based on your symptoms, medical history, and physical exam. During this exam, your health care provider may move your leg into different positions to check for pain. He or she will also press on the muscles of your hip and buttock to see if that increases your symptoms. You may also have an X-ray or MRI. How is this treated? Treatment for this condition may include:  Stopping all activities that cause pain or make your condition worse.  Using heat or ice to relieve pain as told by your health care provider.  Taking medicines to reduce pain and swelling.  Taking a muscle relaxer to release the piriformis muscle.  Doing range-of-motion and strengthening exercises (physical therapy) as told by your health care provider.  Massaging the affected area.  Getting an injection of an anti-inflammatory medicine or muscle relaxer to reduce inflammation and muscle tension.  In rare cases, you may need surgery to cut the muscle and release pressure on the nerve  if other treatments do not work. Follow these instructions at home:  Take over-the-counter and prescription medicines only as told by your health care provider.  Do not sit for long periods. Get up and walk around every 20 minutes or as often as told by your health care provider.  If directed, apply heat to the affected area as often as told by your health care provider. Use the heat source that your health care provider recommends, such as a moist heat pack or a heating pad. ? Place a towel between your skin and  the heat source. ? Leave the heat on for 20-30 minutes. ? Remove the heat if your skin turns bright red. This is especially important if you are unable to feel pain, heat, or cold. You may have a greater risk of getting burned.  If directed, apply ice to the injured area. ? Put ice in a plastic bag. ? Place a towel between your skin and the bag. ? Leave the ice on for 20 minutes, 2-3 times a day.  Do exercises as told by your health care provider.  Return to your normal activities as told by your health care provider. Ask your health care provider what activities are safe for you.  Keep all follow-up visits as told by your health care provider. This is important. How is this prevented?  Do not sit for longer than 20 minutes at a time. When you sit, choose padded surfaces.  Warm up and stretch before being active.  Cool down and stretch after being active.  Give your body time to rest between periods of activity.  Make sure to use equipment that fits you.  Maintain physical fitness, including: ? Strength. ? Flexibility. Contact a health care provider if:  Your pain and stiffness continue or get worse.  Your leg or hip becomes weak.  You have changes in your bowel function or bladder function. This information is not intended to replace advice given to you by your health care provider. Make sure you discuss any questions you have with your health care provider. Document Released: 03/02/2005 Document Revised: 11/05/2015 Document Reviewed: 02/12/2015 Elsevier Interactive Patient Education  [employer].

## 2017-07-23 NOTE — Progress Notes (Signed)
   Subjective:    Patient ID: Debra [name] , female   DOB: 09-01-1955 , 62 y.o..   MRN: [id number]  HPI  Debra [name] is here a 62 year old female with past medical history of hypertension, hyperlipidemia, tobacco abuse, hypothyroidism, anxiety, fatigue for  Chief Complaint  Patient presents with  . Pain    left sided     1. Left hip pain: Patient endorses left-sided hip pain.  She notes that all she wants is an injection to make her feel better.  She notes that she feels pain starting in her left upper buttock and it radiates down her left leg.  This is been going on for the last 2 weeks.  Ibuprofen has helped a little bit, Tylenol has not helped at all.  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  She notes that she also sits for prolonged periods at times at her job.  She notes that the pain is worse in the morning and usually gets better throughout the day after she takes some ibuprofen.  No aggravating factors identified.  Denies any weakness, numbness, tingling.  Review of Systems: Per HPI.   Past Medical History: Patient Active Problem List   Diagnosis Date Noted  . Anxiety 11/04/2016  . Hyperlipidemia 11/04/2016  . Paresthesia 11/01/2015  . Essential hypertension 12/06/2014  . Rhinitis, allergic 12/06/2014  . Tobacco abuse 10/23/2013  . Hypothyroidism 09/12/2013  . Fatigue 09/11/2013  . Insomnia 08/03/2012    Medications: reviewed   Social Hx:  reports that she has been smoking cigarettes.  She has a 3.00 pack-year smoking history. She has never used smokeless tobacco.   Objective:   BP (!) 158/85   Pulse 86   Temp 98.3 F (36.8 C) (Oral)   Wt 146 lb (66.2 kg)   SpO2 98%   BMI 24.30 kg/m  Physical Exam  Gen: NAD, alert, cooperative with exam, well-appearing Left Hip/buttock: Inspection: No skin abnormality seen Palpation: There is positive tenderness over the piriformis.  No SI joint  tenderness. Greater trochanter without tenderness to palpation. ROM: Full in all directions Strength: IR: 5/5, ER: 5/5, Flexion: 5/5, Extension: 5/5, Abduction: 5/5, Adduction: 5/5 Positive FABER  and negative FADIR. Neurovascularly intact  Assessment & Plan:   1. Piriformis muscle pain: History and exam most consistent with left piriformis muscle pain. She had tenderness over palpation of her piriformis muscle additionally + FABER.  She could also have left hip pain that may be radiating to this area.  No red flag symptoms at this time - ketorolac (TORADOL) 30 MG/ML injection 30 mg -After 24 hours we will begin ibuprofen 600 mg 3 times daily as needed for pain -We will return in 3 weeks if no improvement of symptoms  Anders Simmonds, MD [facility], PGY-3

## 2017-08-19 ENCOUNTER — Encounter: Payer: Self-pay | Admitting: Internal Medicine

## 2018-05-25 ENCOUNTER — Other Ambulatory Visit: Payer: Self-pay

## 2018-05-25 DIAGNOSIS — I639 Cerebral infarction, unspecified: Secondary | ICD-10-CM

## 2018-05-26 MED ORDER — ATORVASTATIN CALCIUM 80 MG PO TABS: 80.0000 mg | ORAL_TABLET | Freq: Every day | ORAL | 3 refills | Status: DC

## 2018-05-30 ENCOUNTER — Telehealth: Payer: Self-pay

## 2018-05-30 NOTE — Telephone Encounter (Signed)
Pt LVM on nurse line wanting appt for blood work for thyroid. Pt has not been seen in approx 10 months and needs an appt to be seen in the AM. PCP not available until April.  Appt will need to be with; another provider. Sunday Spillers, CMA

## 2018-06-01 ENCOUNTER — Ambulatory Visit: Payer: BLUE CROSS/BLUE SHIELD | Admitting: Family Medicine

## 2018-06-30 ENCOUNTER — Encounter (HOSPITAL_COMMUNITY): Payer: Self-pay

## 2018-06-30 ENCOUNTER — Other Ambulatory Visit: Payer: Self-pay

## 2018-06-30 ENCOUNTER — Ambulatory Visit (HOSPITAL_COMMUNITY)
Admission: EM | Admit: 2018-06-30 | Discharge: 2018-06-30 | Disposition: A | Discharge status: Home / Self Care | Payer: BLUE CROSS/BLUE SHIELD | Attending: Internal Medicine | Admitting: Internal Medicine

## 2018-06-30 DIAGNOSIS — R0789 Other chest pain: Secondary | ICD-10-CM | POA: Diagnosis not present

## 2018-06-30 DIAGNOSIS — E039 Hypothyroidism, unspecified: Secondary | ICD-10-CM | POA: Diagnosis not present

## 2018-06-30 DIAGNOSIS — I639 Cerebral infarction, unspecified: Secondary | ICD-10-CM | POA: Insufficient documentation

## 2018-06-30 LAB — TSH: TSH: 1.328 u[IU]/mL (ref 0.350–4.500)

## 2018-06-30 MED ORDER — ATORVASTATIN CALCIUM 80 MG PO TABS: 80.0000 mg | ORAL_TABLET | Freq: Every day | ORAL | 3 refills | Status: AC

## 2018-06-30 MED ORDER — LEVOTHYROXINE SODIUM 112 MCG PO TABS: 112.0000 ug | ORAL_TABLET | Freq: Every day | ORAL | 3 refills | Status: AC

## 2018-06-30 NOTE — ED Provider Notes (Addendum)
[facility]    CSN: [id number] Arrival date & time: 06/30/18  1310     History   Chief Complaint Chief Complaint  Patient presents with  . Chest Pain    HPI Debra [name] is a 63 y.o. female with a history of hypothyroidism, hyperlipidemia and hypertension comes to the urgent care with complaint of chest pain of 5 to 6 days duration.  Patient describes the onset as insidious and intermittent in nature.  Pain is sharp and shooting in the lower part of the chest wall in the inframammary area.  No radiation.  Aggravated by movement.  No known relieving factors.  Patient said she has had similar symptoms before and was associated with a tight bra.  She denies any diaphoresis, dizziness, near syncope or syncopal episode.  No nausea or vomiting.  HPI  Past Medical History:  Diagnosis Date  . Allergy   . Hypothyroidism   . Reflux   . Tobacco abuse     Patient Active Problem List   Diagnosis Date Noted  . Anxiety 11/04/2016  . Hyperlipidemia 11/04/2016  . Paresthesia 11/01/2015  . Essential hypertension 12/06/2014  . Rhinitis, allergic 12/06/2014  . Tobacco abuse 10/23/2013  . Hypothyroidism 09/12/2013  . Fatigue 09/11/2013  . Insomnia 08/03/2012    Past Surgical History:  Procedure Laterality Date  . LACERATION REPAIR Right 2008   arm; severe laceration requiring OR repair and PT   . tubal ligation Bilateral     OB History   No obstetric history on file.      Home Medications    Prior to Admission medications   Medication Sig Start Date End Date Taking? Authorizing Provider  aspirin 81 MG tablet Take 81 mg by mouth daily.    [provider]  atorvastatin (LIPITOR) 80 MG tablet Take 1 tablet (80 mg total) by mouth daily. 05/26/18   Sandre Kittylson, Daniel K, MD  clotrimazole-betamethasone (LOTRISONE) cream Apply 1 application topically 2 (two) times daily. 07/10/15   Narda BondsNettey, Ralph A, MD  fluticasone (FLONASE) 50 MCG/ACT nasal spray Place 2 sprays into  both nostrils daily. 12/06/14   Narda BondsNettey, Ralph A, MD  gabapentin (NEURONTIN) 100 MG capsule Take 1 capsule (100 mg total) by mouth 3 (three) times daily. 04/29/17   Arvilla MarketWallace, Catherine Lauren, DO  hydrochlorothiazide (HYDRODIURIL) 12.5 MG tablet Take 1 tablet (12.5 mg total) by mouth daily. 11/04/16   Arvilla MarketWallace, Catherine Lauren, DO  hydrochlorothiazide (HYDRODIURIL) 12.5 MG tablet TAKE 1 TABLET BY MOUTH DAILY 03/19/17   Arvilla MarketWallace, Catherine Lauren, DO  HYDROcodone-acetaminophen (NORCO) 5-325 MG tablet Take 1 tablet by mouth every 6 (six) hours as needed for moderate pain. 06/08/17   Latrelle DodrillMcIntyre, Brittany J, MD  levothyroxine (SYNTHROID, LEVOTHROID) 112 MCG tablet Take 1 tablet (112 mcg total) by mouth daily. 06/07/17   Arvilla MarketWallace, Catherine Lauren, DO  loratadine (CLARITIN) 10 MG tablet Take 1 tablet (10 mg total) by mouth daily. 08/09/14   Hess, Twana FirstBryan R, DO  meloxicam (MOBIC) 15 MG tablet Take 1 tablet (15 mg total) by mouth daily. 04/29/17   Arvilla MarketWallace, Catherine Lauren, DO  mometasone (NASONEX) 50 MCG/ACT nasal spray Place 2 sprays into the nose daily. 08/09/14   Hess, Twana FirstBryan R, DO  PARoxetine (PAXIL) 20 MG tablet Take 1 tablet (20 mg total) by mouth daily. 02/25/17   Arvilla MarketWallace, Catherine Lauren, DO  traZODone (DESYREL) 50 MG tablet Take 0.5-1 tablets (25-50 mg total) by mouth at bedtime as needed for sleep. 11/20/16   Arvilla MarketWallace, Catherine Lauren, DO  valACYclovir (VALTREX) 1000 MG tablet Take 1 tablet (1,000 mg total) by mouth 3 (three) times daily. 06/08/17   Latrelle Dodrill, MD  zolpidem (AMBIEN) 5 MG tablet Take 1 tablet (5 mg total) by mouth at bedtime as needed for sleep. 02/25/17   Arvilla Market, DO    Family History Family History  Problem Relation Age of Onset  . Gallstones Mother   . Stroke Mother 10  . Hypertension Mother   . Cancer Mother        lung, former smoker  . Cancer Sister        breast  . Thyroid disease Brother   . Colon cancer Neg Hx     Social History Social History    Tobacco Use  . Smoking status: Current Every Day Smoker    Packs/day: 0.10    Years: 30.00    Pack years: 3.00    Types: Cigarettes  . Smokeless tobacco: Never Used  . Tobacco comment: working on quitting - down to 3 a day (02/25/17)  Substance Use Topics  . Alcohol use: Yes    Comment: occasional: twice a year  . Drug use: No     Allergies   Patient has no known allergies.   Review of Systems Review of Systems   Physical Exam Triage Vital Signs ED Triage Vitals  Enc Vitals Group     BP      Pulse      Resp      Temp      Temp src      SpO2      Weight      Height      Head Circumference      Peak Flow      Pain Score      Pain Loc      Pain Edu?      Excl. in GC?    No data found.  Updated Vital Signs There were no vitals taken for this visit.  Visual Acuity Right Eye Distance:   Left Eye Distance:   Bilateral Distance:    Right Eye Near:   Left Eye Near:    Bilateral Near:     Physical Exam   UC Treatments / Results  Labs (all labs ordered are listed, but only abnormal results are displayed) Labs Reviewed - No data to display  EKG None  Radiology No results found.  Procedures Procedures (including critical care time)  Medications Ordered in UC Medications - No data to display  Initial Impression / Assessment and Plan / UC Course  I have reviewed the triage vital signs and the nursing notes.  Pertinent labs & imaging results that were available during my care of the patient were reviewed by me and considered in my medical decision making (see chart for details).     1.  Atypical chest pain: EKG shows normal sinus rhythm Cardiology referral for outpatient evaluation possibly stress test Patient was counseled about the plan of care and discharge.  I called the cardiology team on call and they have taken the patient's name and date of birth.  They will call the patient for outpatient work-up.  I gave the patient the office number to  call as well.  Patient verbalized understanding of the plan.  Final Clinical Impressions(s) / UC Diagnoses   Final diagnoses:  None   Discharge Instructions   None    ED Prescriptions    None     Controlled Substance  Prescriptions [place] Controlled Substance Registry consulted? No   Merrilee Jansky, MD 06/30/18 1448    Merrilee Jansky, MD 06/30/18 [phone]

## 2018-06-30 NOTE — ED Notes (Signed)
Patient verbalizes understanding of discharge instructions. Opportunity for questioning and answers were provided. Patient discharged from [facility] by MD. 

## 2018-06-30 NOTE — ED Triage Notes (Signed)
Patient presents to Urgent Care with complaints of centralized cp intermittently since a week ago. Patient states she has reflux and thought that is what it was but her usual reflux meds have not been working. Pt also requests her thyroid levels be checked and to have a refill of her thyroid med refilled since she did not make a PCP appt.

## 2018-06-30 NOTE — ED Notes (Signed)
Bed: UC05 Expected date:  Expected time:  Means of arrival:  Comments: Debra [name] w/ CP

## 2018-07-01 ENCOUNTER — Telehealth (HOSPITAL_COMMUNITY): Payer: Self-pay | Admitting: Emergency Medicine

## 2018-07-01 NOTE — Telephone Encounter (Signed)
Normal labs. Attempted to reach patient. No answer at this time. 

## 2018-07-04 ENCOUNTER — Ambulatory Visit (HOSPITAL_COMMUNITY)
Admission: EM | Admit: 2018-07-04 | Discharge: 2018-07-04 | Disposition: A | Discharge status: Home / Self Care | Payer: BLUE CROSS/BLUE SHIELD | Attending: Internal Medicine | Admitting: Internal Medicine

## 2018-07-04 ENCOUNTER — Other Ambulatory Visit: Payer: Self-pay

## 2018-07-04 ENCOUNTER — Encounter (HOSPITAL_COMMUNITY): Payer: Self-pay

## 2018-07-04 DIAGNOSIS — N3090 Cystitis, unspecified without hematuria: Secondary | ICD-10-CM | POA: Insufficient documentation

## 2018-07-04 LAB — POCT URINALYSIS DIP (DEVICE)
Bilirubin Urine: NEGATIVE
Glucose, UA: NEGATIVE mg/dL
Ketones, ur: NEGATIVE mg/dL
Nitrite: NEGATIVE
Protein, ur: 30 mg/dL — AB
Specific Gravity, Urine: 1.01 (ref 1.005–1.030)
Urobilinogen, UA: 1 mg/dL (ref 0.0–1.0)
pH: 8.5 — ABNORMAL HIGH (ref 5.0–8.0)

## 2018-07-04 MED ORDER — PHENAZOPYRIDINE HCL 200 MG PO TABS: 200.0000 mg | ORAL_TABLET | Freq: Three times a day (TID) | ORAL | 0 refills | Status: DC

## 2018-07-04 MED ORDER — CEPHALEXIN 500 MG PO CAPS: 500.0000 mg | ORAL_CAPSULE | Freq: Two times a day (BID) | ORAL | 0 refills | Status: AC

## 2018-07-04 NOTE — ED Triage Notes (Signed)
Pt cc she states she has some blood in her urine this started 3 days ago. Pt states it a painful urination.

## 2018-07-04 NOTE — Discharge Instructions (Signed)
Keflex to treat the urinary tract infection You urine culture is pending Pyridium for pain Follow up as needed for continued or worsening symptoms

## 2018-07-04 NOTE — ED Notes (Signed)
Patient verbalizes understanding of discharge instructions. Opportunity for questioning and answers were provided. Patient discharged from [facility] by provider.  

## 2018-07-04 NOTE — ED Provider Notes (Signed)
[facility]    CSN: [id number] Arrival date & time: 07/04/18  1258     History   Chief Complaint Chief Complaint  Patient presents with  . Hematuria    HPI Debra [name] is a 63 y.o. female.   Pt is a 63 year old female that presents with dysuria, hematuria, urinary retention and frequency. This has been constant  X 3 days. She has been drinking water and cranberry juice without much relief.  Denies any associated abdominal pain, back pain, fevers, chills, body aches, nausea, vomiting.  Denies any vaginal symptoms.  She is postmenopausal  ROS per HPI      Past Medical History:  Diagnosis Date  . Allergy   . Hypothyroidism   . Reflux   . Tobacco abuse     Patient Active Problem List   Diagnosis Date Noted  . Anxiety 11/04/2016  . Hyperlipidemia 11/04/2016  . Paresthesia 11/01/2015  . Essential hypertension 12/06/2014  . Rhinitis, allergic 12/06/2014  . Tobacco abuse 10/23/2013  . Hypothyroidism 09/12/2013  . Fatigue 09/11/2013  . Insomnia 08/03/2012    Past Surgical History:  Procedure Laterality Date  . LACERATION REPAIR Right 2008   arm; severe laceration requiring OR repair and PT   . tubal ligation Bilateral     OB History   No obstetric history on file.      Home Medications    Prior to Admission medications   Medication Sig Start Date End Date Taking? Authorizing Provider  aspirin 81 MG tablet Take 81 mg by mouth daily.    [provider]  atorvastatin (LIPITOR) 80 MG tablet Take 1 tablet (80 mg total) by mouth daily. 06/30/18   Lamptey, Britta MccreedyPhilip O, MD  cephALEXin (KEFLEX) 500 MG capsule Take 1 capsule (500 mg total) by mouth 2 (two) times daily for 7 days. 07/04/18 07/11/18  Dahlia ByesBast, Layan Zalenski A, NP  clotrimazole-betamethasone (LOTRISONE) cream Apply 1 application topically 2 (two) times daily. 07/10/15   Narda BondsNettey, Ralph A, MD  fluticasone (FLONASE) 50 MCG/ACT nasal spray Place 2 sprays into both nostrils daily. 12/06/14   Narda BondsNettey, Ralph  A, MD  gabapentin (NEURONTIN) 100 MG capsule Take 1 capsule (100 mg total) by mouth 3 (three) times daily. 04/29/17   Arvilla MarketWallace, Catherine Lauren, DO  hydrochlorothiazide (HYDRODIURIL) 12.5 MG tablet Take 1 tablet (12.5 mg total) by mouth daily. 11/04/16   Arvilla MarketWallace, Catherine Lauren, DO  hydrochlorothiazide (HYDRODIURIL) 12.5 MG tablet TAKE 1 TABLET BY MOUTH DAILY 03/19/17   Arvilla MarketWallace, Catherine Lauren, DO  HYDROcodone-acetaminophen (NORCO) 5-325 MG tablet Take 1 tablet by mouth every 6 (six) hours as needed for moderate pain. 06/08/17   Latrelle DodrillMcIntyre, Brittany J, MD  levothyroxine (SYNTHROID) 112 MCG tablet Take 1 tablet (112 mcg total) by mouth daily. 06/30/18   Merrilee JanskyLamptey, Philip O, MD  loratadine (CLARITIN) 10 MG tablet Take 1 tablet (10 mg total) by mouth daily. 08/09/14   Hess, Twana FirstBryan R, DO  meloxicam (MOBIC) 15 MG tablet Take 1 tablet (15 mg total) by mouth daily. 04/29/17   Arvilla MarketWallace, Catherine Lauren, DO  mometasone (NASONEX) 50 MCG/ACT nasal spray Place 2 sprays into the nose daily. 08/09/14   Hess, Twana FirstBryan R, DO  PARoxetine (PAXIL) 20 MG tablet Take 1 tablet (20 mg total) by mouth daily. 02/25/17   Arvilla MarketWallace, Catherine Lauren, DO  phenazopyridine (PYRIDIUM) 200 MG tablet Take 1 tablet (200 mg total) by mouth 3 (three) times daily. 07/04/18   Dahlia ByesBast, Elverda Wendel A, NP  traZODone (DESYREL) 50 MG tablet Take  0.5-1 tablets (25-50 mg total) by mouth at bedtime as needed for sleep. 11/20/16   Arvilla Market, DO  valACYclovir (VALTREX) 1000 MG tablet Take 1 tablet (1,000 mg total) by mouth 3 (three) times daily. 06/08/17   Latrelle Dodrill, MD  zolpidem (AMBIEN) 5 MG tablet Take 1 tablet (5 mg total) by mouth at bedtime as needed for sleep. 02/25/17   Arvilla Market, DO    Family History Family History  Problem Relation Age of Onset  . Gallstones Mother   . Stroke Mother 43  . Hypertension Mother   . Cancer Mother        lung, former smoker  . Cancer Sister        breast  . Thyroid disease Brother    . Colon cancer Neg Hx     Social History Social History   Tobacco Use  . Smoking status: Current Every Day Smoker    Packs/day: 0.10    Years: 30.00    Pack years: 3.00    Types: Cigarettes  . Smokeless tobacco: Never Used  . Tobacco comment: working on quitting - down to 3 a day (02/25/17)  Substance Use Topics  . Alcohol use: Yes    Comment: occasional: twice a year  . Drug use: No     Allergies   Patient has no known allergies.   Review of Systems Review of Systems   Physical Exam Triage Vital Signs ED Triage Vitals  Enc Vitals Group     BP 07/04/18 1319 (!) 172/102     Pulse Rate 07/04/18 1319 77     Resp 07/04/18 1319 18     Temp --      Temp Source 07/04/18 1319 Oral     SpO2 07/04/18 1319 100 %     Weight 07/04/18 1325 150 lb (68 kg)     Height --      Head Circumference --      Peak Flow --      Pain Score 07/04/18 1324 8     Pain Loc --      Pain Edu? --      Excl. in [place]? --    No data found.  Updated Vital Signs BP (!) 172/102 (BP Location: Right Arm)   Pulse 77   Resp 18   Wt 150 lb (68 kg)   SpO2 100%   BMI 24.96 kg/m   Visual Acuity Right Eye Distance:   Left Eye Distance:   Bilateral Distance:    Right Eye Near:   Left Eye Near:    Bilateral Near:     Physical Exam Vitals signs and nursing note reviewed.  Constitutional:      General: She is not in acute distress.    Appearance: Normal appearance. She is not ill-appearing, toxic-appearing or diaphoretic.  HENT:     Head: Normocephalic.     Nose: Nose normal.     Mouth/Throat:     Pharynx: Oropharynx is clear.  Eyes:     Conjunctiva/sclera: Conjunctivae normal.  Neck:     Musculoskeletal: Normal range of motion.  Pulmonary:     Effort: Pulmonary effort is normal.  Abdominal:     Palpations: Abdomen is soft.     Tenderness: There is no abdominal tenderness.  Musculoskeletal: Normal range of motion.  Skin:    General: Skin is warm and dry.     Findings: No rash.   Neurological:     Mental Status: She is  alert.  Psychiatric:        Mood and Affect: Mood normal.      UC Treatments / Results  Labs (all labs ordered are listed, but only abnormal results are displayed) Labs Reviewed  POCT URINALYSIS DIP (DEVICE) - Abnormal; Notable for the following components:      Result Value   Hgb urine dipstick MODERATE (*)    pH 8.5 (*)    Protein, ur 30 (*)    Leukocytes,Ua SMALL (*)    All other components within normal limits  URINE CULTURE    EKG None  Radiology No results found.  Procedures Procedures (including critical care time)  Medications Ordered in UC Medications - No data to display  Initial Impression / Assessment and Plan / UC Course  I have reviewed the triage vital signs and the nursing notes.  Pertinent labs & imaging results that were available during my care of the patient were reviewed by me and considered in my medical decision making (see chart for details).    Cystitis with hematuria  Urine with small leuks and moderate hemoglobin. We will go ahead and treat for urinary tract infection based on symptoms and urinalysis results Urine sent for culture Keflex twice a day for 7 days and Pyridium as needed for discomfort Follow up as needed for continued or worsening symptoms   Final Clinical Impressions(s) / UC Diagnoses   Final diagnoses:  Cystitis     Discharge Instructions     Keflex to treat the urinary tract infection You urine culture is pending Pyridium for pain Follow up as needed for continued or worsening symptoms     ED Prescriptions    Medication Sig Dispense Auth. Provider   cephALEXin (KEFLEX) 500 MG capsule Take 1 capsule (500 mg total) by mouth 2 (two) times daily for 7 days. 14 capsule Wenceslaus Gist A, NP   phenazopyridine (PYRIDIUM) 200 MG tablet Take 1 tablet (200 mg total) by mouth 3 (three) times daily. 6 tablet Dahlia Byes A, NP     Controlled Substance Prescriptions [place] Controlled  Substance Registry consulted? Not Applicable   Janace Aris, NP 07/04/18 [phone]

## 2018-07-06 LAB — URINE CULTURE: Culture: >=100000 — AB

## 2018-07-07 ENCOUNTER — Telehealth (HOSPITAL_COMMUNITY): Payer: Self-pay | Admitting: Emergency Medicine

## 2018-07-07 NOTE — Telephone Encounter (Signed)
Urine culture was positive for e coli and was given  keflex at urgent care visit. Pt contacted and made aware, educated on completing antibiotic and to follow up if symptoms are persistent. Verbalized understanding. Pt also asking about her thyroid medicine, per Dr. Leonides Grills, to keep taking thyroid meds and follow up with PCP

## 2018-07-12 ENCOUNTER — Ambulatory Visit: Payer: BLUE CROSS/BLUE SHIELD | Admitting: Cardiology

## 2019-05-11 ENCOUNTER — Inpatient Hospital Stay (HOSPITAL_COMMUNITY)
Admission: EM | Admit: 2019-05-11 | Discharge: 2019-05-12 | DRG: 303 | Disposition: A | Discharge status: Home / Self Care | Payer: No Typology Code available for payment source | Source: Ambulatory Visit | Attending: Cardiology | Admitting: Cardiology

## 2019-05-11 ENCOUNTER — Encounter (HOSPITAL_COMMUNITY): Payer: Self-pay | Admitting: Emergency Medicine

## 2019-05-11 ENCOUNTER — Observation Stay (HOSPITAL_BASED_OUTPATIENT_CLINIC_OR_DEPARTMENT_OTHER): Payer: No Typology Code available for payment source

## 2019-05-11 ENCOUNTER — Other Ambulatory Visit: Payer: Self-pay

## 2019-05-11 ENCOUNTER — Ambulatory Visit (INDEPENDENT_AMBULATORY_CARE_PROVIDER_SITE_OTHER)
Admission: EM | Admit: 2019-05-11 | Discharge: 2019-05-11 | Disposition: A | Discharge status: Home / Self Care | Payer: No Typology Code available for payment source | Source: Home / Self Care | Attending: Internal Medicine | Admitting: Internal Medicine

## 2019-05-11 ENCOUNTER — Emergency Department (HOSPITAL_COMMUNITY): Payer: No Typology Code available for payment source

## 2019-05-11 ENCOUNTER — Encounter (HOSPITAL_COMMUNITY): Payer: Self-pay

## 2019-05-11 DIAGNOSIS — R079 Chest pain, unspecified: Secondary | ICD-10-CM

## 2019-05-11 DIAGNOSIS — R778 Other specified abnormalities of plasma proteins: Secondary | ICD-10-CM

## 2019-05-11 DIAGNOSIS — I200 Unstable angina: Secondary | ICD-10-CM

## 2019-05-11 DIAGNOSIS — E785 Hyperlipidemia, unspecified: Secondary | ICD-10-CM | POA: Diagnosis present

## 2019-05-11 DIAGNOSIS — Z7982 Long term (current) use of aspirin: Secondary | ICD-10-CM | POA: Diagnosis not present

## 2019-05-11 DIAGNOSIS — Z8249 Family history of ischemic heart disease and other diseases of the circulatory system: Secondary | ICD-10-CM | POA: Diagnosis not present

## 2019-05-11 DIAGNOSIS — E039 Hypothyroidism, unspecified: Secondary | ICD-10-CM | POA: Diagnosis present

## 2019-05-11 DIAGNOSIS — R519 Headache, unspecified: Secondary | ICD-10-CM | POA: Diagnosis present

## 2019-05-11 DIAGNOSIS — G4700 Insomnia, unspecified: Secondary | ICD-10-CM | POA: Diagnosis present

## 2019-05-11 DIAGNOSIS — I169 Hypertensive crisis, unspecified: Secondary | ICD-10-CM

## 2019-05-11 DIAGNOSIS — F419 Anxiety disorder, unspecified: Secondary | ICD-10-CM | POA: Diagnosis present

## 2019-05-11 DIAGNOSIS — Z79890 Hormone replacement therapy (postmenopausal): Secondary | ICD-10-CM

## 2019-05-11 DIAGNOSIS — Z20822 Contact with and (suspected) exposure to covid-19: Secondary | ICD-10-CM | POA: Diagnosis present

## 2019-05-11 DIAGNOSIS — Z79891 Long term (current) use of opiate analgesic: Secondary | ICD-10-CM

## 2019-05-11 DIAGNOSIS — Z9114 Patient's other noncompliance with medication regimen: Secondary | ICD-10-CM | POA: Diagnosis not present

## 2019-05-11 DIAGNOSIS — Z809 Family history of malignant neoplasm, unspecified: Secondary | ICD-10-CM | POA: Diagnosis not present

## 2019-05-11 DIAGNOSIS — Z720 Tobacco use: Secondary | ICD-10-CM

## 2019-05-11 DIAGNOSIS — I248 Other forms of acute ischemic heart disease: Secondary | ICD-10-CM | POA: Diagnosis not present

## 2019-05-11 DIAGNOSIS — Z716 Tobacco abuse counseling: Secondary | ICD-10-CM

## 2019-05-11 DIAGNOSIS — Z79899 Other long term (current) drug therapy: Secondary | ICD-10-CM

## 2019-05-11 DIAGNOSIS — I160 Hypertensive urgency: Secondary | ICD-10-CM | POA: Diagnosis present

## 2019-05-11 DIAGNOSIS — Z8349 Family history of other endocrine, nutritional and metabolic diseases: Secondary | ICD-10-CM

## 2019-05-11 DIAGNOSIS — I10 Essential (primary) hypertension: Secondary | ICD-10-CM | POA: Diagnosis present

## 2019-05-11 DIAGNOSIS — I25110 Atherosclerotic heart disease of native coronary artery with unstable angina pectoris: Secondary | ICD-10-CM | POA: Diagnosis present

## 2019-05-11 DIAGNOSIS — J309 Allergic rhinitis, unspecified: Secondary | ICD-10-CM | POA: Diagnosis present

## 2019-05-11 HISTORY — DX: Essential (primary) hypertension: I10

## 2019-05-11 HISTORY — DX: Unspecified osteoarthritis, unspecified site: M19.90

## 2019-05-11 LAB — BASIC METABOLIC PANEL
Anion gap: 11 (ref 5–15)
BUN: 13 mg/dL (ref 8–23)
CO2: 26 mmol/L (ref 22–32)
Calcium: 9.1 mg/dL (ref 8.9–10.3)
Chloride: 105 mmol/L (ref 98–111)
Creatinine, Ser: 0.71 mg/dL (ref 0.44–1.00)
GFR calc Af Amer: >60 mL/min (ref >60)
GFR calc non Af Amer: >60 mL/min (ref >60)
Glucose, Bld: 90 mg/dL (ref 70–99)
Potassium: 3.5 mmol/L (ref 3.5–5.1)
Sodium: 142 mmol/L (ref 135–145)

## 2019-05-11 LAB — CBC
HCT: 36.1 % (ref 36.0–46.0)
Hemoglobin: 11.9 g/dL — ABNORMAL LOW (ref 12.0–15.0)
MCH: 33.1 pg (ref 26.0–34.0)
MCHC: 33 g/dL (ref 30.0–36.0)
MCV: 100.6 fL — ABNORMAL HIGH (ref 80.0–100.0)
Platelets: 344 10*3/uL (ref 150–400)
RBC: 3.59 MIL/uL — ABNORMAL LOW (ref 3.87–5.11)
RDW: 13.7 % (ref 11.5–15.5)
WBC: 9.9 10*3/uL (ref 4.0–10.5)
nRBC: 0 % (ref 0.0–0.2)

## 2019-05-11 LAB — TROPONIN I (HIGH SENSITIVITY)
Troponin I (High Sensitivity): 21 ng/L — ABNORMAL HIGH (ref <18)
Troponin I (High Sensitivity): 27 ng/L — ABNORMAL HIGH (ref <18)

## 2019-05-11 LAB — ECHOCARDIOGRAM COMPLETE
Height: 64 in
Weight: 2098.78 oz

## 2019-05-11 LAB — RESPIRATORY PANEL BY RT PCR (FLU A&B, COVID)
Influenza A by PCR: NEGATIVE
Influenza B by PCR: NEGATIVE
SARS Coronavirus 2 by RT PCR: NEGATIVE

## 2019-05-11 LAB — HIV ANTIBODY (ROUTINE TESTING W REFLEX): HIV Screen 4th Generation wRfx: NONREACTIVE

## 2019-05-11 LAB — HEPARIN LEVEL (UNFRACTIONATED): Heparin Unfractionated: 0.49 IU/mL (ref 0.30–0.70)

## 2019-05-11 LAB — HEMOGLOBIN A1C
Hgb A1c MFr Bld: 5.3 % (ref 4.8–5.6)
Mean Plasma Glucose: 105.41 mg/dL

## 2019-05-11 MED ORDER — HEPARIN (PORCINE) 25000 UT/250ML-% IV SOLN
800.0000 [IU]/h | INTRAVENOUS | Status: DC
  Administered 2019-05-11: 13:00:00 800 [IU]/h via INTRAVENOUS
  Filled 2019-05-11: qty 250

## 2019-05-11 MED ORDER — HYDROCHLOROTHIAZIDE 25 MG PO TABS
25.0000 mg | ORAL_TABLET | Freq: Every day | ORAL | Status: DC
  Administered 2019-05-11 – 2019-05-12 (×2): 25 mg via ORAL
  Filled 2019-05-11 (×2): qty 1

## 2019-05-11 MED ORDER — ASPIRIN 81 MG PO CHEW
324.0000 mg | CHEWABLE_TABLET | Freq: Once | ORAL | Status: AC
  Administered 2019-05-11: 324 mg via ORAL
  Filled 2019-05-11: qty 4

## 2019-05-11 MED ORDER — ONDANSETRON HCL 4 MG/2ML IJ SOLN: 4.0000 mg | Freq: Four times a day (QID) | INTRAMUSCULAR | Status: DC | PRN

## 2019-05-11 MED ORDER — ACETAMINOPHEN 325 MG PO TABS
650.0000 mg | ORAL_TABLET | ORAL | Status: DC | PRN
  Administered 2019-05-12: 08:00:00 650 mg via ORAL
  Filled 2019-05-11: qty 2

## 2019-05-11 MED ORDER — ASPIRIN 300 MG RE SUPP: 300.0000 mg | RECTAL | Status: DC

## 2019-05-11 MED ORDER — ASPIRIN EC 81 MG PO TBEC
81.0000 mg | DELAYED_RELEASE_TABLET | Freq: Every day | ORAL | Status: DC
  Administered 2019-05-12: 09:00:00 81 mg via ORAL
  Filled 2019-05-11: qty 1

## 2019-05-11 MED ORDER — NITROGLYCERIN IN D5W 200-5 MCG/ML-% IV SOLN
0.0000 ug/min | INTRAVENOUS | Status: DC
  Administered 2019-05-11: 17:00:00 10 ug/min via INTRAVENOUS
  Administered 2019-05-11: 14:00:00 5 ug/min via INTRAVENOUS
  Filled 2019-05-11: qty 250

## 2019-05-11 MED ORDER — ATORVASTATIN CALCIUM 80 MG PO TABS
80.0000 mg | ORAL_TABLET | Freq: Every day | ORAL | Status: DC
  Administered 2019-05-11 – 2019-05-12 (×2): 80 mg via ORAL
  Filled 2019-05-11 (×2): qty 1

## 2019-05-11 MED ORDER — ASPIRIN 81 MG PO TABS: 81.0000 mg | ORAL_TABLET | Freq: Every day | ORAL | Status: DC

## 2019-05-11 MED ORDER — NITROGLYCERIN PEDIATRIC IV INFUSION 100 MCG/ML: 5.0000 ug/kg/min | INTRAVENOUS | Status: DC

## 2019-05-11 MED ORDER — AMLODIPINE BESYLATE 5 MG PO TABS
5.0000 mg | ORAL_TABLET | Freq: Every day | ORAL | Status: DC
  Administered 2019-05-11: 5 mg via ORAL
  Filled 2019-05-11: qty 1

## 2019-05-11 MED ORDER — NITROGLYCERIN 0.4 MG SL SUBL: 0.4000 mg | SUBLINGUAL_TABLET | SUBLINGUAL | Status: DC | PRN

## 2019-05-11 MED ORDER — SODIUM CHLORIDE 0.9% FLUSH
3.0000 mL | Freq: Once | INTRAVENOUS | Status: AC
  Administered 2019-05-11: 12:00:00 3 mL via INTRAVENOUS

## 2019-05-11 MED ORDER — HEPARIN BOLUS VIA INFUSION
4000.0000 [IU] | Freq: Once | INTRAVENOUS | Status: AC
  Administered 2019-05-11: 13:00:00 4000 [IU] via INTRAVENOUS
  Filled 2019-05-11: qty 4000

## 2019-05-11 MED ORDER — LEVOTHYROXINE SODIUM 112 MCG PO TABS
112.0000 ug | ORAL_TABLET | Freq: Every day | ORAL | Status: DC
  Administered 2019-05-11 – 2019-05-12 (×2): 112 ug via ORAL
  Filled 2019-05-11 (×2): qty 1

## 2019-05-11 MED ORDER — ASPIRIN 81 MG PO CHEW: 324.0000 mg | CHEWABLE_TABLET | ORAL | Status: DC

## 2019-05-11 NOTE — Progress Notes (Signed)
ANTICOAGULATION CONSULT NOTE - Initial Consult  Pharmacy Consult for heparin Indication: chest pain/ACS  No Known Allergies  Patient Measurements: Height: 5\' 5"  (165.1 cm) Weight: 150 lb (68 kg) IBW/kg (Calculated) : 57 Heparin Dosing Weight: 68kg  Vital Signs: Temp: 98 F (36.7 C) (02/25 1132) Temp Source: Oral (02/25 1132) BP: 200/97 (02/25 1215) Pulse Rate: 81 (02/25 1215)  Labs: Recent Labs    05/11/19 1136  HGB 11.9*  HCT 36.1  PLT 344  CREATININE 0.71  TROPONINIHS 21*    Estimated Creatinine Clearance: 64.8 mL/min (by C-G formula based on SCr of 0.71 mg/dL).   Medical History: Past Medical History:  Diagnosis Date  . Allergy   . Hypothyroidism   . Reflux   . Tobacco abuse     Medications:  Infusions:  . heparin 800 Units/hr (05/11/19 1327)  . nitroGLYCERIN 5 mcg/min (05/11/19 1332)    Assessment: Debra [name] presented to the ED with CP. Troponin minimally elevated. To start IV heparin. Baseline Hgb is slightly low at 11.9 and platelets are WNL. She is not on anticoagulation PTA.   Goal of Therapy:  Heparin level 0.3-0.7 units/ml Monitor platelets by anticoagulation protocol: Yes   Plan:  Heparin bolus 4000 units IV x 1 Heparin gtt 800 units/hr Check a 6 hr heparin level Daily heparin level and CBC  Debra [name], 05/13/19 05/11/2019,1:02 PM

## 2019-05-11 NOTE — ED Provider Notes (Signed)
[facility]    CSN: [id number] Arrival date & time: 05/11/19  1019      History   Chief Complaint Chief Complaint  Patient presents with  . Chest Pain    HPI Debra [name] is a 64 y.o. female with history of hypothyroidism, hyperlipidemia, hypertension comes to urgent care with complaints of squeezing sharp left-sided chest pain which is been intermittent.  Patient describes the pain as 10 out of 10 at the height of the pain.  Is aggravated by movement and relieved by rest.  She denies any diaphoresis, nausea or vomiting.  No palpitations.  She denies cough or sputum production.  No dizziness, near syncope or syncopal episodes.   HPI  Past Medical History:  Diagnosis Date  . Allergy   . Hypothyroidism   . Reflux   . Tobacco abuse     Patient Active Problem List   Diagnosis Date Noted  . Anxiety 11/04/2016  . Hyperlipidemia 11/04/2016  . Paresthesia 11/01/2015  . Essential hypertension 12/06/2014  . Rhinitis, allergic 12/06/2014  . Tobacco abuse 10/23/2013  . Hypothyroidism 09/12/2013  . Fatigue 09/11/2013  . Insomnia 08/03/2012    Past Surgical History:  Procedure Laterality Date  . LACERATION REPAIR Right 2008   arm; severe laceration requiring OR repair and PT   . tubal ligation Bilateral     OB History   No obstetric history on file.      Home Medications    Prior to Admission medications   Medication Sig Start Date End Date Taking? Authorizing Provider  aspirin 81 MG tablet Take 81 mg by mouth daily.    [provider]  atorvastatin (LIPITOR) 80 MG tablet Take 1 tablet (80 mg total) by mouth daily. 06/30/18   Oshua Mcconaha, Britta Mccreedy, MD  clotrimazole-betamethasone (LOTRISONE) cream Apply 1 application topically 2 (two) times daily. 07/10/15   Narda Bonds, MD  fluticasone (FLONASE) 50 MCG/ACT nasal spray Place 2 sprays into both nostrils daily. 12/06/14   Narda Bonds, MD  gabapentin (NEURONTIN) 100 MG capsule Take 1 capsule (100 mg  total) by mouth 3 (three) times daily. 04/29/17   Arvilla Market, DO  hydrochlorothiazide (HYDRODIURIL) 12.5 MG tablet Take 1 tablet (12.5 mg total) by mouth daily. 11/04/16   Arvilla Market, DO  hydrochlorothiazide (HYDRODIURIL) 12.5 MG tablet TAKE 1 TABLET BY MOUTH DAILY 03/19/17   Arvilla Market, DO  HYDROcodone-acetaminophen (NORCO) 5-325 MG tablet Take 1 tablet by mouth every 6 (six) hours as needed for moderate pain. 06/08/17   Latrelle Dodrill, MD  levothyroxine (SYNTHROID) 112 MCG tablet Take 1 tablet (112 mcg total) by mouth daily. 06/30/18   Merrilee Jansky, MD  loratadine (CLARITIN) 10 MG tablet Take 1 tablet (10 mg total) by mouth daily. 08/09/14   Hess, Twana First, DO  meloxicam (MOBIC) 15 MG tablet Take 1 tablet (15 mg total) by mouth daily. 04/29/17   Arvilla Market, DO  mometasone (NASONEX) 50 MCG/ACT nasal spray Place 2 sprays into the nose daily. 08/09/14   Hess, Twana First, DO  PARoxetine (PAXIL) 20 MG tablet Take 1 tablet (20 mg total) by mouth daily. 02/25/17   Arvilla Market, DO  traZODone (DESYREL) 50 MG tablet Take 0.5-1 tablets (25-50 mg total) by mouth at bedtime as needed for sleep. 11/20/16   Arvilla Market, DO  valACYclovir (VALTREX) 1000 MG tablet Take 1 tablet (1,000 mg total) by mouth 3 (three) times daily. 06/08/17   Levert Feinstein  J, MD  zolpidem (AMBIEN) 5 MG tablet Take 1 tablet (5 mg total) by mouth at bedtime as needed for sleep. 02/25/17   Nicolette Bang, DO    Family History Family History  Problem Relation Age of Onset  . Gallstones Mother   . Stroke Mother 51  . Hypertension Mother   . Cancer Mother        lung, former smoker  . Cancer Sister        breast  . Thyroid disease Brother   . Colon cancer Neg Hx     Social History Social History   Tobacco Use  . Smoking status: Current Every Day Smoker    Packs/day: 0.10    Years: 30.00    Pack years: 3.00    Types: Cigarettes    . Smokeless tobacco: Never Used  . Tobacco comment: working on quitting - down to 3 a day (02/25/17)  Substance Use Topics  . Alcohol use: Yes    Comment: occasional: twice a year  . Drug use: No     Allergies   Patient has no known allergies.   Review of Systems Review of Systems  Constitutional: Negative.   Respiratory: Positive for chest tightness.   Cardiovascular: Positive for chest pain. Negative for palpitations.  Gastrointestinal: Negative for nausea and vomiting.  Musculoskeletal: Negative for arthralgias and back pain.  Neurological: Negative for dizziness and light-headedness.     Physical Exam Triage Vital Signs ED Triage Vitals  Enc Vitals Group     BP 05/11/19 1042 (!) 183/106     Pulse Rate 05/11/19 1042 86     Resp 05/11/19 1042 17     Temp 05/11/19 1042 98.1 F (36.7 C)     Temp Source 05/11/19 1042 Oral     SpO2 05/11/19 1042 99 %     Weight --      Height --      Head Circumference --      Peak Flow --      Pain Score 05/11/19 1043 7     Pain Loc --      Pain Edu? --      Excl. in [place]? --    No data found.  Updated Vital Signs BP (!) 183/106 (BP Location: Right Arm) Comment: smoker  Pulse 86   Temp 98.1 F (36.7 C) (Oral)   Resp 17   SpO2 99%   Visual Acuity Right Eye Distance:   Left Eye Distance:   Bilateral Distance:    Right Eye Near:   Left Eye Near:    Bilateral Near:     Physical Exam Vitals and nursing note reviewed.  Constitutional:      General: She is not in acute distress.    Appearance: She is normal weight. She is not ill-appearing.  Neck:     Thyroid: No thyromegaly.  Cardiovascular:     Rate and Rhythm: Normal rate and regular rhythm.  No extrasystoles are present.    Chest Wall: PMI is not displaced.     Heart sounds: Normal heart sounds. Heart sounds not distant. No murmur.  Pulmonary:     Effort: Pulmonary effort is normal. No tachypnea, accessory muscle usage or respiratory distress.     Breath sounds:  Normal breath sounds. No decreased breath sounds, wheezing, rhonchi or rales.  Chest:     Chest wall: No mass, deformity, tenderness or crepitus.  Abdominal:     Palpations: Abdomen is soft.  Musculoskeletal:  Cervical back: Normal range of motion and neck supple.  Skin:    Capillary Refill: Capillary refill takes less than 2 seconds.  Neurological:     Mental Status: She is alert.      UC Treatments / Results  Labs (all labs ordered are listed, but only abnormal results are displayed) Labs Reviewed - No data to display  EKG   Radiology No results found.  Procedures Procedures (including critical care time)  Medications Ordered in UC Medications - No data to display  Initial Impression / Assessment and Plan / UC Course  I have reviewed the triage vital signs and the nursing notes.  Pertinent labs & imaging results that were available during my care of the patient were reviewed by me and considered in my medical decision making (see chart for details).     1.  Chest pain: EKG showed normal sinus rhythm with T wave inversions in lead V1 and V2 which is comparable to previous EKGs Given the patient's age and risk factors, patient was advised to go to the emergency department for MI rule out and further evaluation if indicated.  Patient agrees to go to the ED after initial reluctance to do so.  She is currently agreeable to go to the ED. Final Clinical Impressions(s) / UC Diagnoses   Final diagnoses:  Chest pain, unspecified type   Discharge Instructions   None    ED Prescriptions    None     PDMP not reviewed this encounter.   Merrilee Jansky, MD 05/11/19 1147

## 2019-05-11 NOTE — ED Triage Notes (Signed)
Pt here from UC with c/o chest pain , non radiating ,no sob or n/v .

## 2019-05-11 NOTE — Progress Notes (Signed)
ANTICOAGULATION CONSULT NOTE  Pharmacy Consult for heparin Indication: chest pain/ACS  No Known Allergies  Patient Measurements: Height: 5\' 4"  (162.6 cm) Weight: 131 lb 2.8 oz (59.5 kg) IBW/kg (Calculated) : 54.7 Heparin Dosing Weight: 68kg  Vital Signs: Temp: 97.8 F (36.6 C) (02/25 1642) Temp Source: Oral (02/25 1642) BP: 142/85 (02/25 1805) Pulse Rate: 83 (02/25 1805)  Labs: Recent Labs    05/11/19 1136 05/11/19 1323 05/11/19 1918  HGB 11.9*  --   --   HCT 36.1  --   --   PLT 344  --   --   HEPARINUNFRC  --   --  0.49  CREATININE 0.71  --   --   TROPONINIHS 21* 27*  --     Estimated Creatinine Clearance: 62.2 mL/min (by C-G formula based on SCr of 0.71 mg/dL).   Medical History: Past Medical History:  Diagnosis Date  . Allergy   . Hypothyroidism   . Reflux   . Tobacco abuse     Medications:  Infusions:  . heparin 800 Units/hr (05/11/19 1327)  . nitroGLYCERIN 10 mcg/min (05/11/19 1650)    Assessment: 78 yof presented to the ED with CP. Troponin minimally elevated. To start IV heparin. Baseline Hgb is slightly low at 11.9 and platelets are WNL. She is not on anticoagulation PTA. Initial heparin level therapeutic at 0.49. No active bleed issues reported.  Cardiology note from today states to stop IV heparin for now with elevated troponin likely due to demand ischemia from uncontrolled hypertension. Per discussion with on-call Cardiology MD 64, ok to d/c heparin orders tonight.  Goal of Therapy:  Heparin level 0.3-0.7 units/ml Monitor platelets by anticoagulation protocol: Yes   Plan:  D/c heparin IV per discussion with Cardiology Fellow Likely coronary CTA tomorrow per notes   Truman Hayward, PharmD, BCPS Please check AMION for all [facility] contact numbers Clinical Pharmacist 05/11/2019 8:00 PM

## 2019-05-11 NOTE — ED Provider Notes (Signed)
[facility]PART[facility] Provider Note   CSN: [id number] Arrival date & time: 05/11/19  1124     History No chief complaint on file.   Debra [name] is a 64 y.o. female.  HPI   This patient is a very pleasant 64 year old female with a known history of hypothyroidism, hypertension and high cholesterol who takes medications for each of these problems presents from the urgent care with a complaint of chest pain.  She reports that over the last couple of months she has had some intermittent chest pain seems to be more frequent over the last couple of weeks and last night had a particularly painful episode in the left chest described as a throbbing and aching which became quite severe and required her to sit down and rest.  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  She does get some associated shortness of breath nausea with her symptoms.  She denies any swelling of the legs and has not had any fevers or chills or diarrhea and denies any coughing or recent illnesses.  She came in this morning after symptoms came back this morning.  She went to the urgent care where she was redirected due to severe hypertension and ongoing pain.  At this time her symptoms are mild, located in the left mid and lateral chest and extremely improved compared to prior.  She was not given any medication prior to arrival.  Past Medical History:  Diagnosis Date  . Allergy   . Hypothyroidism   . Reflux   . Tobacco abuse     Patient Active Problem List   Diagnosis Date Noted  . Anxiety 11/04/2016  . Hyperlipidemia 11/04/2016  . Paresthesia 11/01/2015  . Essential hypertension 12/06/2014  . Rhinitis, allergic 12/06/2014  . Tobacco abuse 10/23/2013  . Hypothyroidism 09/12/2013  . Fatigue 09/11/2013  . Insomnia 08/03/2012    Past Surgical History:  Procedure Laterality Date  . LACERATION REPAIR  Right 2008   arm; severe laceration requiring OR repair and PT   . tubal ligation Bilateral      OB History   No obstetric history on file.     Family History  Problem Relation Age of Onset  . Gallstones Mother   . Stroke Mother 40  . Hypertension Mother   . Cancer Mother        lung, former smoker  . Cancer Sister        breast  . Thyroid disease Brother   . Colon cancer Neg Hx     Social History   Tobacco Use  . Smoking status: Current Every Day Smoker    Packs/day: 0.10    Years: 30.00    Pack years: 3.00    Types: Cigarettes  . Smokeless tobacco: Never Used  . Tobacco comment: working on quitting - down to 3 a day (02/25/17)  Substance Use Topics  . Alcohol use: Yes    Comment: occasional: twice a year  . Drug use: No    Home Medications Prior to Admission medications   Medication Sig Start Date End Date Taking? Authorizing Provider  aspirin 81 MG tablet Take 81 mg by mouth daily.    [provider]  atorvastatin (LIPITOR) 80 MG tablet Take 1 tablet (80 mg total) by mouth daily. 06/30/18   Lamptey, Myrene Galas, MD  clotrimazole-betamethasone (LOTRISONE) cream Apply 1 application topically 2 (two)  times daily. 07/10/15   Narda Bonds, MD  fluticasone (FLONASE) 50 MCG/ACT nasal spray Place 2 sprays into both nostrils daily. 12/06/14   Narda Bonds, MD  gabapentin (NEURONTIN) 100 MG capsule Take 1 capsule (100 mg total) by mouth 3 (three) times daily. 04/29/17   Arvilla Market, DO  hydrochlorothiazide (HYDRODIURIL) 12.5 MG tablet Take 1 tablet (12.5 mg total) by mouth daily. 11/04/16   Arvilla Market, DO  hydrochlorothiazide (HYDRODIURIL) 12.5 MG tablet TAKE 1 TABLET BY MOUTH DAILY 03/19/17   Arvilla Market, DO  HYDROcodone-acetaminophen (NORCO) 5-325 MG tablet Take 1 tablet by mouth every 6 (six) hours as needed for moderate pain. 06/08/17   Latrelle Dodrill, MD  levothyroxine (SYNTHROID) 112 MCG tablet Take 1 tablet (112  mcg total) by mouth daily. 06/30/18   Merrilee Jansky, MD  loratadine (CLARITIN) 10 MG tablet Take 1 tablet (10 mg total) by mouth daily. 08/09/14   Hess, Twana First, DO  meloxicam (MOBIC) 15 MG tablet Take 1 tablet (15 mg total) by mouth daily. 04/29/17   Arvilla Market, DO  mometasone (NASONEX) 50 MCG/ACT nasal spray Place 2 sprays into the nose daily. 08/09/14   Hess, Twana First, DO  PARoxetine (PAXIL) 20 MG tablet Take 1 tablet (20 mg total) by mouth daily. 02/25/17   Arvilla Market, DO  traZODone (DESYREL) 50 MG tablet Take 0.5-1 tablets (25-50 mg total) by mouth at bedtime as needed for sleep. 11/20/16   Arvilla Market, DO  valACYclovir (VALTREX) 1000 MG tablet Take 1 tablet (1,000 mg total) by mouth 3 (three) times daily. 06/08/17   Latrelle Dodrill, MD  zolpidem (AMBIEN) 5 MG tablet Take 1 tablet (5 mg total) by mouth at bedtime as needed for sleep. 02/25/17   Arvilla Market, DO    Allergies    Patient has no known allergies.  Review of Systems   Review of Systems  All other systems reviewed and are negative.   Physical Exam Updated Vital Signs BP (!) 183/92 (BP Location: Right Arm)   Pulse 89   Temp 98 F (36.7 C) (Oral)   Resp 18   SpO2 100%   Physical Exam Vitals and nursing note reviewed.  Constitutional:      General: She is not in acute distress.    Appearance: She is well-developed.  HENT:     Head: Normocephalic and atraumatic.     Mouth/Throat:     Pharynx: No oropharyngeal exudate.  Eyes:     General: No scleral icterus.       Right eye: No discharge.        Left eye: No discharge.     Conjunctiva/sclera: Conjunctivae normal.     Pupils: Pupils are equal, round, and reactive to light.  Neck:     Thyroid: No thyromegaly.     Vascular: No JVD.  Cardiovascular:     Rate and Rhythm: Normal rate and regular rhythm.     Heart sounds: Normal heart sounds. No murmur. No friction rub. No gallop.   Pulmonary:     Effort:  Pulmonary effort is normal. No respiratory distress.     Breath sounds: Normal breath sounds. No wheezing or rales.  Abdominal:     General: Bowel sounds are normal. There is no distension.     Palpations: Abdomen is soft. There is no mass.     Tenderness: There is no abdominal tenderness.  Musculoskeletal:  General: No tenderness. Normal range of motion.     Cervical back: Normal range of motion and neck supple.  Lymphadenopathy:     Cervical: No cervical adenopathy.  Skin:    General: Skin is warm and dry.     Findings: No erythema or rash.  Neurological:     Mental Status: She is alert.     Coordination: Coordination normal.  Psychiatric:        Behavior: Behavior normal.     ED Results / Procedures / Treatments   Labs (all labs ordered are listed, but only abnormal results are displayed) Labs Reviewed  CBC - Abnormal; Notable for the following components:      Result Value   RBC 3.59 (*)    Hemoglobin 11.9 (*)    MCV 100.6 (*)    All other components within normal limits  TROPONIN I (HIGH SENSITIVITY) - Abnormal; Notable for the following components:   Troponin I (High Sensitivity) 21 (*)    All other components within normal limits  BASIC METABOLIC PANEL    EKG EKG Interpretation  Date/Time:  Thursday May 11 2019 12:07:29 EST Ventricular Rate:  80 PR Interval:    QRS Duration: 87 QT Interval:  402 QTC Calculation: 464 R Axis:   76 Text Interpretation: Sinus rhythm Consider left ventricular hypertrophy Nonspecific T abnrm, anterolateral leads since last tracing no significant change Confirmed by Eber Hong ([id number]) on 05/11/2019 12:20:41 PM   Radiology DG Chest 2 View  Result Date: 05/11/2019 CLINICAL DATA:  SOB, CP and increased BP on/off 6 months - pt having increased left side CP since yesterday, pain gets worse with exertion - hx of smoker, htn EXAM: CHEST - 2 VIEW COMPARISON:  05/29/2015 FINDINGS: Normal heart, mediastinum and hila. Lungs are  hyperexpanded but clear. No pleural effusion or pneumothorax. Skeletal structures are intact. IMPRESSION: No active cardiopulmonary disease. Electronically Signed   By: Amie Portland M.D.   On: 05/11/2019 12:02    Procedures .Critical Care Performed by: Eber Hong, MD Authorized by: Eber Hong, MD   Critical care provider statement:    Critical care time (minutes):  35   Critical care time was exclusive of:  Separately billable procedures and treating other patients and teaching time   Critical care was necessary to treat or prevent imminent or life-threatening deterioration of the following conditions:  Cardiac failure   Critical care was time spent personally by me on the following activities:  Blood draw for specimens, development of treatment plan with patient or surrogate, discussions with consultants, evaluation of patient's response to treatment, examination of patient, obtaining history from patient or surrogate, ordering and performing treatments and interventions, ordering and review of laboratory studies, ordering and review of radiographic studies, pulse oximetry, re-evaluation of patient's condition and review of old charts   (including critical care time)  Medications Ordered in ED Medications  nitroGLYCERIN 25mg /221mL (100 mcg/ml) Pediatric IV infusion (has no administration in time range)  sodium chloride flush (NS) 0.9 % injection 3 mL (3 mLs Intravenous Given 05/11/19 1208)  aspirin chewable tablet 324 mg (324 mg Oral Given 05/11/19 1224)    ED Course  I have reviewed the triage vital signs and the nursing notes.  Pertinent labs & imaging results that were available during my care of the patient were reviewed by me and considered in my medical decision making (see chart for details).  Clinical Course as of May 10 1256  Thu May 11, 2019  1248 I have  personally viewed the patient's chest x-ray which does show some hyperexpansion however I do not see any signs of  pneumonia or pneumothorax.  Additionally there is normal lab work with regards to CBC and basic metabolic panel however there is an elevated troponin which is consistent with ischemia.  Given the patient's underlying complaints it seems reasonable to talk to cardiology about admission   [BM]    Clinical Course User Index [BM] Eber Hong, MD   MDM Rules/Calculators/A&P                      The patient's cardiac exam is unremarkable, her EKG has signs of left ventricular hypertrophy but no signs of acute ischemia.  She does have some slightly abnormal T waves in leads V1 and V2 but given the downgoing QRS this is not terribly abnormal.  Will obtain lab work and discuss with cardiology given this angina or even possible unstable angina appearance of her symptoms.  We will also give 4 baby aspirin.  The patient is agreeable to the plan  Troponin elevated - c/w possible NSTEMI or UA given the patients history and symptoms.  Pt needs admisison  D/w Cardiology who will admit.  Heparin and Nitro gtt started  Final Clinical Impression(s) / ED Diagnoses Final diagnoses:  Unstable angina (HCC)  Hypertensive crisis  Elevated troponin      Eber Hong, MD 05/11/19 1259

## 2019-05-11 NOTE — H&P (Addendum)
History & Physical    Patient ID: ELONDA GIULIANO MRN: [id number], DOB/AGE: 09/22/1955   Admit date: 05/11/2019  Primary Physician: Laruth Bouchard, MD Primary Cardiologist: New to [facility]   Patient Profile    Ms. Staebell is a 64yo F with a hx of hypothyroidism, hyperlipidemia, tobacco use and hypertension who is being admitted to [facility] for the evaluation of chest pain.   Past Medical History   Past Medical History:  Diagnosis Date  . Allergy   . Hypothyroidism   . Reflux   . Tobacco abuse     Past Surgical History:  Procedure Laterality Date  . LACERATION REPAIR Right 2008   arm; severe laceration requiring OR repair and PT   . tubal ligation Bilateral     Allergies  No Known Allergies  History of Present Illness    Ms. Merced has a hx as stated above who initially presented to an urgent care center with complaints of squeezing, sharp left-sided chest pain which was noted to be intermittent, reported as a 10 out of 10 in intensity.  Patient stated that her symptoms began several months ago as intermittent chest pains occurring mostly at work.  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  She states that more recently her symptoms have become more frequent with increased intensity.  She states that she is always on the go at work and will notice that the pains will start very dull and as she increases her exertion, they become more intense.  Chest pain starts near her left sided nipple line and will radiate slightly up anteriorly.  She has some associated nausea however does correlate some of that to her second dose of Covid vaccine.  She states that she was working last night with symptoms as described above at which time her coworkers recommended that she proceed to an urgent care or the ED for further work-up.  She initially presented to an urgent care center where an EKG was performed which showed no acute changes with T wave inversions in leads V1 and V2,  similar to prior tracings. Given her CRFs of hyperlipidemia, hypertension and tobacco use it was recommended that she proceed to the emergency department for further evaluation.  Initially, she was reluctant to do so however eventually became agreeable.  On ED presentation, high-sensitivity troponin found to be mildly elevated at 21 with a repeat of 27.  BP found to be markedly elevated with SBP's in the one eighty to low two hundred range.  She states that she was previously well established with a PCP however given multiple insurance changes with her job she has not found a stable PCP for a while.  She states she has been taking her statin and hypothyroid medication however has not been taking any antihypertensives. Creatinine at 0.71 with all other lab work essentially within normal limits. CXR showed no active cardiopulmonary disease. She has never undergone any cardiac assessment including stress testing or cardiac catheterization.  She has no family history of reported CAD.  Does state she had an uncle and a son who both died secondary to pulmonary emboli.  Given her mildly elevated troponin, IV heparin infusion was initiated for presumed ACS as well as NTG gtt given markedly elevated BPs on presentation of 200/99, 184/90, 194/91.  As above, has not been taking her HCTZ for BP control however reports taking her statin.  She continues to have mild chest pressure.  She denies shortness of  breath, LE edema, orthopnea, PND, dizziness, recent cough, fatigue, palpitations or syncope.  Home Medications    Prior to Admission medications   Medication Sig Start Date End Date Taking? Authorizing Provider  acetaminophen (TYLENOL) 650 MG CR tablet Take 1,300 mg by mouth every 8 (eight) hours as needed for pain.   Yes [provider]  aspirin 81 MG tablet Take 81 mg by mouth daily.   Yes [provider]  atorvastatin (LIPITOR) 80 MG tablet Take 1 tablet (80 mg total) by mouth daily. 06/30/18   Yes Lamptey, Myrene Galas, MD  ibuprofen (ADVIL) 800 MG tablet Take 800 mg by mouth every 6 (six) hours as needed.   Yes [provider]  levothyroxine (SYNTHROID) 112 MCG tablet Take 1 tablet (112 mcg total) by mouth daily. 06/30/18  Yes Lamptey, Myrene Galas, MD  clotrimazole-betamethasone (LOTRISONE) cream Apply 1 application topically 2 (two) times daily. Patient not taking: Reported on 05/11/2019 07/10/15   Mariel Aloe, MD  fluticasone [facility]) 50 MCG/ACT nasal spray Place 2 sprays into both nostrils daily. Patient not taking: Reported on 05/11/2019 12/06/14   Mariel Aloe, MD  gabapentin (NEURONTIN) 100 MG capsule Take 1 capsule (100 mg total) by mouth 3 (three) times daily. Patient not taking: Reported on 05/11/2019 04/29/17   Nicolette Bang, DO  hydrochlorothiazide (HYDRODIURIL) 12.5 MG tablet Take 1 tablet (12.5 mg total) by mouth daily. Patient not taking: Reported on 05/11/2019 11/04/16   Nicolette Bang, DO  hydrochlorothiazide (HYDRODIURIL) 12.5 MG tablet TAKE 1 TABLET BY MOUTH DAILY Patient not taking: Reported on 05/11/2019 03/19/17   Nicolette Bang, DO  HYDROcodone-acetaminophen (NORCO) 5-325 MG tablet Take 1 tablet by mouth every 6 (six) hours as needed for moderate pain. Patient not taking: Reported on 05/11/2019 06/08/17   Leeanne Rio, MD  loratadine (CLARITIN) 10 MG tablet Take 1 tablet (10 mg total) by mouth daily. Patient not taking: Reported on 05/11/2019 08/09/14   Nolon Rod, DO  meloxicam (MOBIC) 15 MG tablet Take 1 tablet (15 mg total) by mouth daily. Patient not taking: Reported on 05/11/2019 04/29/17   Nicolette Bang, DO  mometasone (NASONEX) 50 MCG/ACT nasal spray Place 2 sprays into the nose daily. Patient not taking: Reported on 05/11/2019 08/09/14   Kennith Maes R, DO  PARoxetine (PAXIL) 20 MG tablet Take 1 tablet (20 mg total) by mouth daily. Patient not taking: Reported on 05/11/2019 02/25/17   Nicolette Bang, DO  traZODone (DESYREL) 50 MG tablet Take 0.5-1 tablets (25-50 mg total) by mouth at bedtime as needed for sleep. Patient not taking: Reported on 05/11/2019 11/20/16   Nicolette Bang, DO  valACYclovir (VALTREX) 1000 MG tablet Take 1 tablet (1,000 mg total) by mouth 3 (three) times daily. Patient not taking: Reported on 05/11/2019 06/08/17   Leeanne Rio, MD  zolpidem (AMBIEN) 5 MG tablet Take 1 tablet (5 mg total) by mouth at bedtime as needed for sleep. Patient not taking: Reported on 05/11/2019 02/25/17   Nicolette Bang, DO    Family History    Family History  Problem Relation Age of Onset  . Gallstones Mother   . Stroke Mother 6  . Hypertension Mother   . Cancer Mother        lung, former smoker  . Cancer Sister        breast  . Thyroid disease Brother   . Colon cancer Neg Hx     Social History  Social History   Socioeconomic History  . Marital status: Single    Spouse name: Not on file  . Number of children: Not on file  . Years of education: Not on file  . Highest education level: Not on file  Occupational History  . Not on file  Tobacco Use  . Smoking status: Current Every Day Smoker    Packs/day: 0.10    Years: 30.00    Pack years: 3.00    Types: Cigarettes  . Smokeless tobacco: Never Used  . Tobacco comment: working on quitting - down to 3 a day (02/25/17)  Substance and Sexual Activity  . Alcohol use: Yes    Comment: occasional: twice a year  . Drug use: No  . Sexual activity: Not on file  Other Topics Concern  . Not on file  Social History Narrative  . Not on file   Social Determinants of Health   Financial Resource Strain:   . Difficulty of Paying Living Expenses: Not on file  Food Insecurity:   . Worried About Programme researcher, broadcasting/film/video in the Last Year: Not on file  . Ran Out of Food in the Last Year: Not on file  Transportation Needs:   . Lack of Transportation (Medical): Not on file  . Lack of Transportation  (Non-Medical): Not on file  Physical Activity:   . Days of Exercise per Week: Not on file  . Minutes of Exercise per Session: Not on file  Stress:   . Feeling of Stress : Not on file  Social Connections:   . Frequency of Communication with Friends and Family: Not on file  . Frequency of Social Gatherings with Friends and Family: Not on file  . Attends Religious Services: Not on file  . Active Member of Clubs or Organizations: Not on file  . Attends Banker Meetings: Not on file  . Marital Status: Not on file  Intimate Partner Violence:   . Fear of Current or Ex-Partner: Not on file  . Emotionally Abused: Not on file  . Physically Abused: Not on file  . Sexually Abused: Not on file     Review of Systems   General: Denies fevers, chills, decreased appetite and fatigue. Admits to good overall health. Cardiovascular: + chest pain. No palpitations, and SOB at rest or with exertion. Denies lower extremity swelling.  Respiratory- Denies SOB, wheezing, coughing, increased respirations, or sputum production. Denies the need for extra pillows for sleep at night.  Gastrointestinal: + Nausea.  Denies abdominal, epigastric pain, and vomiting. Neurological: Denies headaches, syncope, falls, extremity weakness, numbness, and tingling. No changes in mental status.  All other systems reviewed and are otherwise negative except as noted above.  Physical Exam    Blood pressure (!) 194/91, pulse 72, temperature 98 F (36.7 C), temperature source Oral, resp. rate 13, height 5\' 5"  (1.651 m), weight 68 kg, SpO2 100 %.   General: Well developed, well nourished, NAD Neck: Negative for carotid bruits. No JVD Lungs:Clear to ausculation bilaterally. No wheezes, rales, or rhonchi. Breathing is unlabored. Cardiovascular: RRR with S1 S2. No murmurs Abdomen: Soft, non-tender, non-distended. No obvious abdominal masses. Extremities: No edema. DP pulses 2+ bilaterally Neuro: Alert and oriented. No  focal deficits. No facial asymmetry. MAE spontaneously. Psych: Responds to questions appropriately with normal affect.    Labs    Troponin (Point of Care Test) No results for input(s): TROPIPOC in the last 72 hours. No results for input(s): CKTOTAL, CKMB, TROPONINI in the last  72 hours. Lab Results  Component Value Date   WBC 9.9 05/11/2019   HGB 11.9 (L) 05/11/2019   HCT 36.1 05/11/2019   MCV 100.6 (H) 05/11/2019   PLT 344 05/11/2019    Recent Labs  Lab 05/11/19 1136  NA 142  K 3.5  CL 105  CO2 26  BUN 13  CREATININE 0.71  CALCIUM 9.1  GLUCOSE 90   Lab Results  Component Value Date   CHOL 173 11/04/2016   HDL 57 11/04/2016   LDLCALC 105 (H) 11/04/2016   TRIG 57 11/04/2016   No results found for: [facility]   Radiology Studies    DG Chest 2 View  Result Date: 05/11/2019 CLINICAL DATA:  SOB, CP and increased BP on/off 6 months - pt having increased left side CP since yesterday, pain gets worse with exertion - hx of smoker, htn EXAM: CHEST - 2 VIEW COMPARISON:  05/29/2015 FINDINGS: Normal heart, mediastinum and hila. Lungs are hyperexpanded but clear. No pleural effusion or pneumothorax. Skeletal structures are intact. IMPRESSION: No active cardiopulmonary disease. Electronically Signed   By: Amie Portland M.D.   On: 05/11/2019 12:02   ECG & Cardiac Imaging    EKG 05/11/2019: NSR with TWI in leads V1, V2 and no acute ST changes  Assessment & Plan     1. 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  She has associated diaphoresis.  CRFs include hypertension, hyperlipidemia and tobacco use.  Has no family history or personal history of CAD. EKG with NSR and TW I in V1-V2 otherwise no acute ischemic changes.  High-sensitivity troponin found to be mildly elevated at 21>>27 however likely demand ischemia in the setting of markedly elevated BP.  SBP's ranging in the 180-200 range. Currently on IV NTG infusion with moderate change.  IV heparin infusion initiated for presumed ACS. -Plan to restart antihypertensives with HCTZ and amlodipine to gain BP control -Will stop IV Heparin for now as suspect demand ischemia as cause of her mild troponin elevation and not acute coronary syndrome -We will also plan for echocardiogram to further evaluate LV and structural function. Also plan for coronary CT tomorrow to rule out CAD. -Keep n.p.o. after midnight  2.  Hypertensive urgency: -BPs on presentation found to be markedly elevated at 183/92, 200/97, 194/91, 200/99, 190/95.  Patient states she was previously on HCTZ however has been without her antihypertensive for quite some time secondary to problems with insurance and PCP providers. -We will restart HCTZ at 25 mg and add amlodipine 5 mg in hopes to titrate NTG off -Creatinine stable at 0.71  3.  Hyperlipidemia: -Patient reports history of hyperlipidemia.  Last LDL noted in epic from 10/2016 at 105 -Continue high intensity atorvastatin 80 mg p.o. daily -We will recheck lipid panel  4.  Tobacco use: -Reports approximately 3 cigarettes/day -Cessation strongly encouraged   Severity of Illness: The appropriate patient status for this patient is OBSERVATION. Observation status is judged to be reasonable and necessary in order to provide the required intensity of service to ensure the patient's safety. The patient's presenting symptoms, physical exam findings, and initial radiographic and laboratory data in the context of their medical condition is felt to place them at decreased risk for further clinical deterioration. Furthermore, it is anticipated that the patient will be medically stable for discharge from the hospital within 2 midnights of admission. The following factors  support the patient status of observation.   " The patient's presenting symptoms include chest pain. " The physical exam  findings include hypertensive crisis, chest pain. " The initial radiographic and laboratory data are elevated BP, elevated troponin.    SignedGeorgie Chard NP-C [facility] Pager: [phone] 05/11/2019, @NOW    Patient seen and examined.  Agree with above documentation.  Ms. Albano is a 64 year old female with a history of hypertension, hyperlipidemia, hypothyroidism, tobacco use who is being admitted for evaluation of chest pain.  She reports that she has been off her antihypertensives for over 6 months.  She reports that she has been having intermittent chest pain.  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  States that when she is exerting herself, she will note left-sided sharp chest pain.  Resolves with rest.  States that it has occurred about once per week.  Yesterday had an episode that occurred while she was working and resolved with rest.  She went to the urgent care and given her risk factors was recommended to go to the ED.  In the ED, noted to the hypertensive to 200s over 90s.  High-sensitivity troponin was 21 then 27.  Labs otherwise notable for hemoglobin L 11.9, creatinine 0.7, Covid negative.  Chest x-ray unremarkable.  EKG personally reviewed, shows sinus rhythm, rate 84, no ST abnormalities. On exam, patient is alert and oriented, regular rate and rhythm, no murmurs, lungs CTAB, no LE edema or JVD.  For her hypertension, she was started on a nitroglycerin drip in the ED.  Will restart home hydrochlorothiazide and add amlodipine and wean off gtt.  Will check TTE.  Suspect troponin elevation is secondary to demand ischemia in setting of uncontrolled hypertension.  However, given her atypical chest pain and significant coronary risk factors, warrants further evaluation.  We will plan for likely coronary CTA tomorrow.  Lawyer, MD

## 2019-05-11 NOTE — ED Notes (Signed)
Unable to transport EKG from triage. Additional EKG performed in exam room.

## 2019-05-11 NOTE — ED Triage Notes (Signed)
Pt presents with left side chest pain yesterday; pt is everyday smoker and has hypertension.

## 2019-05-11 NOTE — Progress Notes (Addendum)
Received report from Adam in ED at 1621.  Patient arrrive approx 1635. Placed and verified on telemetry box 28. No complaints of chest pain. A&Ox4. No distress.   Paged [facility] at [phone]. per orders possible cath tomorrow, time? pt. wants more info about cath. unable to reach [facility] nor NP [place]. Upon callback at 1824, will get CTA not cath; ok to hold  NPO at midnight. Patient informed.

## 2019-05-11 NOTE — Progress Notes (Signed)
  Echocardiogram 2D Echocardiogram has been performed.  Debra [name] A Debra [name] 05/11/2019, 5:14 PM

## 2019-05-12 ENCOUNTER — Encounter (HOSPITAL_COMMUNITY): Payer: Self-pay | Admitting: Cardiology

## 2019-05-12 ENCOUNTER — Inpatient Hospital Stay (HOSPITAL_COMMUNITY): Payer: No Typology Code available for payment source

## 2019-05-12 ENCOUNTER — Other Ambulatory Visit: Payer: Self-pay

## 2019-05-12 DIAGNOSIS — R079 Chest pain, unspecified: Secondary | ICD-10-CM

## 2019-05-12 DIAGNOSIS — I160 Hypertensive urgency: Secondary | ICD-10-CM

## 2019-05-12 DIAGNOSIS — I248 Other forms of acute ischemic heart disease: Secondary | ICD-10-CM

## 2019-05-12 LAB — LIPID PANEL
Cholesterol: 128 mg/dL (ref 0–200)
HDL: 59 mg/dL (ref >40)
LDL Cholesterol: 62 mg/dL (ref 0–99)
Total CHOL/HDL Ratio: 2.2 RATIO
Triglycerides: 35 mg/dL (ref <150)
VLDL: 7 mg/dL (ref 0–40)

## 2019-05-12 LAB — CBC
HCT: 32.1 % — ABNORMAL LOW (ref 36.0–46.0)
Hemoglobin: 10.8 g/dL — ABNORMAL LOW (ref 12.0–15.0)
MCH: 33.1 pg (ref 26.0–34.0)
MCHC: 33.6 g/dL (ref 30.0–36.0)
MCV: 98.5 fL (ref 80.0–100.0)
Platelets: 301 10*3/uL (ref 150–400)
RBC: 3.26 MIL/uL — ABNORMAL LOW (ref 3.87–5.11)
RDW: 13.8 % (ref 11.5–15.5)
WBC: 8.7 10*3/uL (ref 4.0–10.5)
nRBC: 0 % (ref 0.0–0.2)

## 2019-05-12 LAB — BASIC METABOLIC PANEL
Anion gap: 10 (ref 5–15)
BUN: 10 mg/dL (ref 8–23)
CO2: 25 mmol/L (ref 22–32)
Calcium: 9 mg/dL (ref 8.9–10.3)
Chloride: 105 mmol/L (ref 98–111)
Creatinine, Ser: 0.71 mg/dL (ref 0.44–1.00)
GFR calc Af Amer: >60 mL/min (ref >60)
GFR calc non Af Amer: >60 mL/min (ref >60)
Glucose, Bld: 96 mg/dL (ref 70–99)
Potassium: 3.5 mmol/L (ref 3.5–5.1)
Sodium: 140 mmol/L (ref 135–145)

## 2019-05-12 IMAGING — CT CT HEART MORP W/ CTA COR W/ SCORE W/ CA W/CM &/OR W/O CM
4 of 7 series · 8 of 20 positions shown, 9 images · non-contrast
Comparison: None.
COMPARISON: None.

Addendum:
EXAM:
OVER-READ INTERPRETATION  CT CHEST

The following report is an over-read performed by radiologist Dr.
Rosenete Altieri [[email or web address]] on 05/12/2019. This
over-read does not include interpretation of cardiac or coronary
anatomy or pathology. The coronary calcium score/coronary CTA
interpretation by the cardiologist is attached.
CLINICAL DATA: 63F presents with chest pain
Cardiac/Coronary CTA
TECHNIQUE: The patient was scanned on a [employer] Force scanner.

[Series 6: best diast 75 % · axial · 0.39mm/px · z∈[-175,-133]mm · 2 of 318 slices shown]
[im 106/318  vessel]
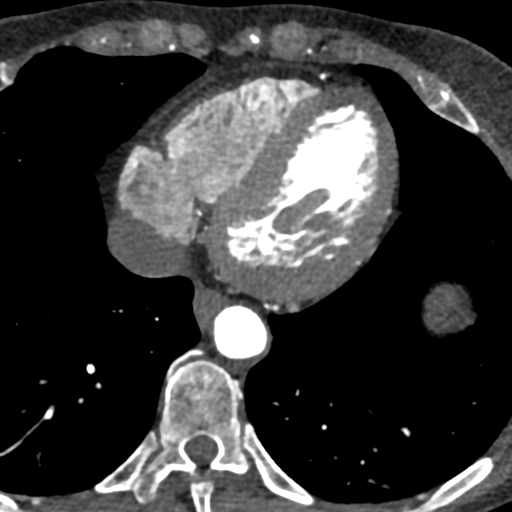
[im 212/318  vessel]
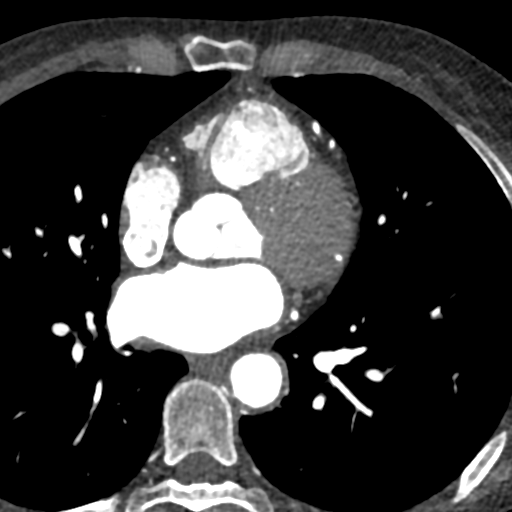

[Series 8: best syst · axial · 0.39mm/px · z∈[-175,-133]mm · 2 of 318 slices shown, 3 images]
[im 106/318  vessel]
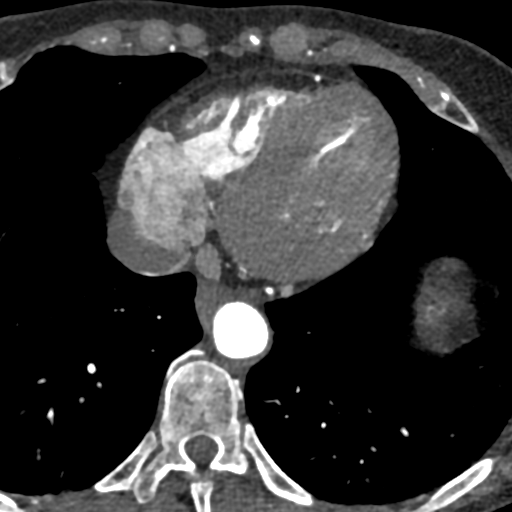
[im 106/318  lung]
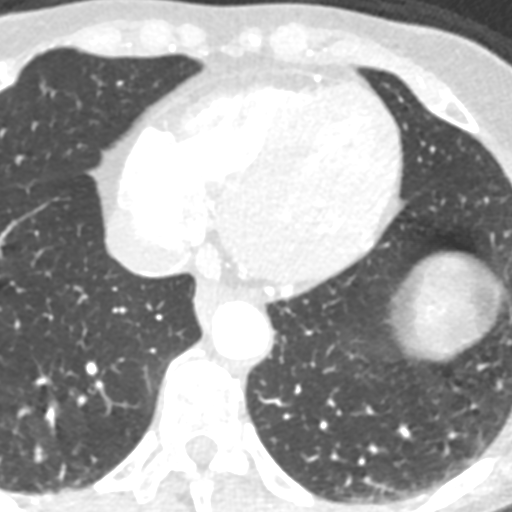
[im 212/318  vessel]
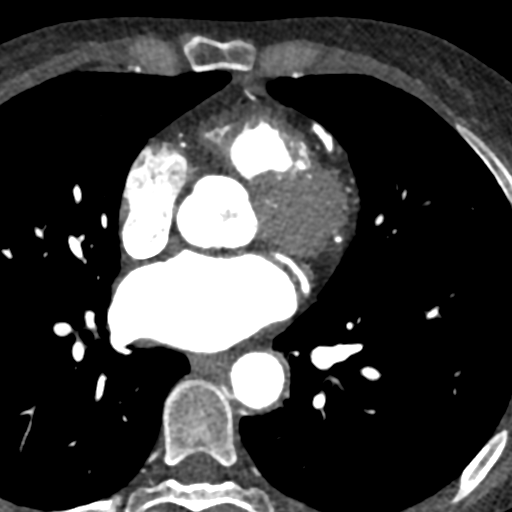

[Series 9: ts diast sharp · axial · 0.39mm/px · z∈[-175,-133]mm · 2 of 318 slices shown]
[im 106/318  lung]
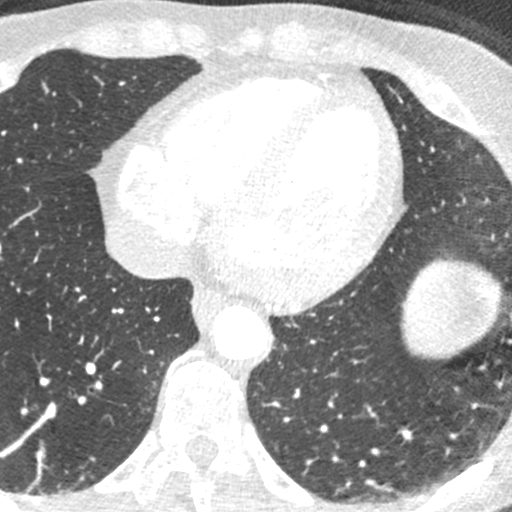
[im 212/318  lung]
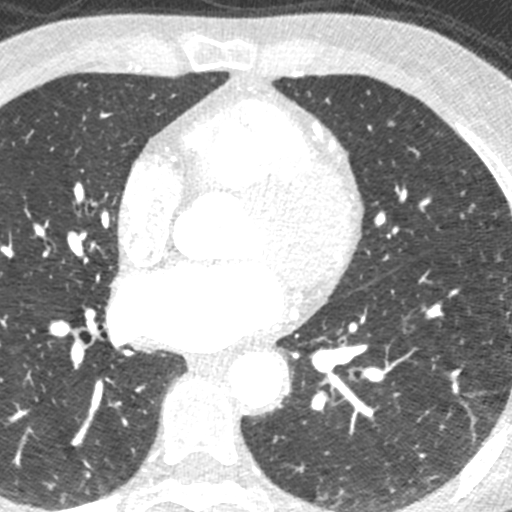

[Series 10: ts syst sharp · axial · 0.39mm/px · z∈[-175,-133]mm · 2 of 318 slices shown]
[im 106/318  lung]
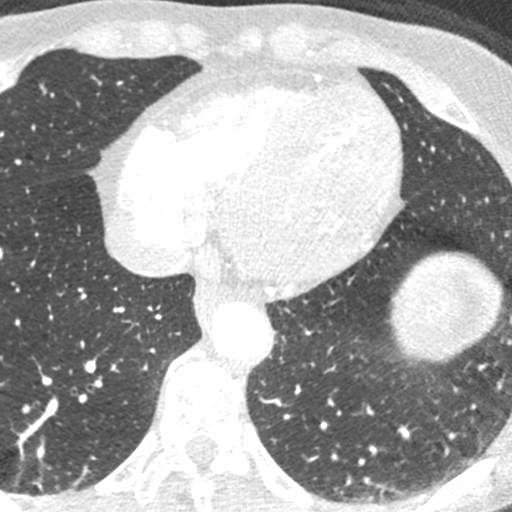
[im 212/318  lung]
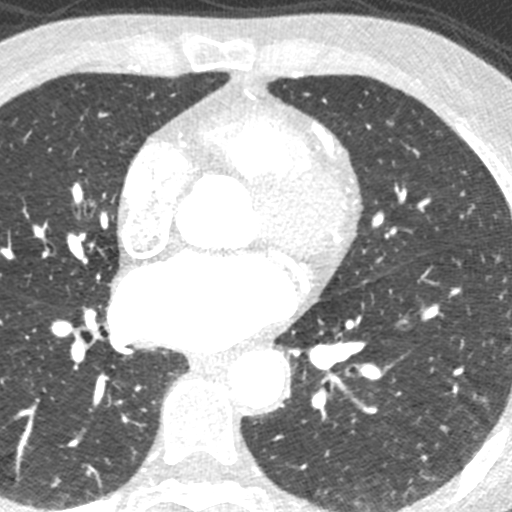

[8 of 20 positions shown; findings below may reference images not displayed]

FINDINGS: Within the visualized portions of the thorax there are no suspicious
appearing pulmonary nodules or masses, there is no acute
consolidative airspace disease, no pleural effusions, no
pneumothorax and no lymphadenopathy. Visualized portions of the
upper abdomen are unremarkable. There are no aggressive appearing
lytic or blastic lesions noted in the visualized portions of the
skeleton.
IMPRESSION: No significant incidental noncardiac findings are noted.
FINDINGS: A 100 kV prospective scan was triggered in the descending thoracic
aorta at 111 HU's. Axial non-contrast 3 mm slices were carried out
through the heart. The data set was analyzed on a dedicated work
station and scored using the Agatson method. Gantry rotation speed
was 250 msecs and collimation was .6 mm. No beta blockade and 0.8 mg
of sl NTG was given. The 3D data set was reconstructed in 5%
intervals of the 67-82 % of the R-R cycle. Diastolic phases were
analyzed on a dedicated work station using MPR, MIP and VRT modes.
The patient received 80 cc of contrast.

Coronary Arteries:  Normal coronary origin.  Left dominant

RCA is small (2mm) and nondominant.

Left main is a large artery that gives rise to LAD and LCX arteries.

LAD is a large vessel. There is noncalcified plaque in the proximal
LAD causing minimal (0-24%) stenosis.

LCX is a dominant artery. There is noncalcified plaque in the mid
LCX causing mild (25-49%) stenosis

Other findings:

Left Ventricle: Normal size

Left Atrium: Mild enlargement

Pulmonary Veins: Normal configuration

Right Ventricle: Normal size

Right Atrium: Normal size

Cardiac valves: No calcifications

Thoracic aorta: Normal size

Pulmonary Arteries: Normal size

Systemic Veins: Normal drainage

Pericardium: Normal thickness
IMPRESSION: 1. Coronary calcium score of 0. This was 0 percentile for age and
sex matched control.

2. Nonobstructive CAD. Noncalcified plaques causing up to minimal
(0-24%) stenosis in the proximal LAD and mild (25-49%) stenosis in
the mid LCX

CAD-RADS 2. Mild non-obstructive CAD (25-49%). Consider
non-atherosclerotic causes of chest pain. Consider preventive
therapy and risk factor modification.

*** End of Addendum ***
EXAM:
OVER-READ INTERPRETATION  CT CHEST

The following report is an over-read performed by radiologist Dr.
Rosenete Altieri [REDACTED] on 05/12/2019. This
over-read does not include interpretation of cardiac or coronary
anatomy or pathology. The coronary calcium score/coronary CTA
interpretation by the cardiologist is attached.
FINDINGS: Within the visualized portions of the thorax there are no suspicious
appearing pulmonary nodules or masses, there is no acute
consolidative airspace disease, no pleural effusions, no
pneumothorax and no lymphadenopathy. Visualized portions of the
upper abdomen are unremarkable. There are no aggressive appearing
lytic or blastic lesions noted in the visualized portions of the
skeleton.
IMPRESSION: No significant incidental noncardiac findings are noted.

## 2019-05-12 MED ORDER — AMLODIPINE BESYLATE 10 MG PO TABS: 10.0000 mg | ORAL_TABLET | Freq: Every day | ORAL | 3 refills | Status: DC

## 2019-05-12 MED ORDER — NITROGLYCERIN 0.4 MG SL SUBL
SUBLINGUAL_TABLET | SUBLINGUAL | Status: AC
  Filled 2019-05-12: qty 2

## 2019-05-12 MED ORDER — HYDROCHLOROTHIAZIDE 25 MG PO TABS: 25.0000 mg | ORAL_TABLET | Freq: Every day | ORAL | 3 refills | Status: AC

## 2019-05-12 MED ORDER — IOHEXOL 350 MG/ML SOLN
80.0000 mL | Freq: Once | INTRAVENOUS | Status: AC | PRN
  Administered 2019-05-12: 11:00:00 80 mL via INTRAVENOUS

## 2019-05-12 MED ORDER — METOPROLOL TARTRATE 50 MG PO TABS
50.0000 mg | ORAL_TABLET | Freq: Once | ORAL | Status: AC
  Administered 2019-05-12: 50 mg via ORAL
  Filled 2019-05-12: qty 1

## 2019-05-12 MED ORDER — MELATONIN 3 MG PO TABS
6.0000 mg | ORAL_TABLET | Freq: Once | ORAL | Status: AC
  Administered 2019-05-12: 02:00:00 6 mg via ORAL
  Filled 2019-05-12: qty 2

## 2019-05-12 MED ORDER — AMLODIPINE BESYLATE 10 MG PO TABS
10.0000 mg | ORAL_TABLET | Freq: Every day | ORAL | Status: DC
  Administered 2019-05-12: 10 mg via ORAL
  Filled 2019-05-12 (×2): qty 1

## 2019-05-12 NOTE — Progress Notes (Signed)
Progress Note  Patient Name: Debra [name] Date of Encounter: 05/12/2019  Primary Cardiologist: No primary care provider on file.   Subjective   BP remains elevated, weaning off NTG gtt.  She reported mild headache this morning, improved with tylenol.  Reports intermittent sharp chest pain, lasting few seconds and resolving.  Otherwise no complaints this AM.  Inpatient Medications    Scheduled Meds: . amLODipine  10 mg Oral Daily  . aspirin  324 mg Oral NOW   Or  . aspirin  300 mg Rectal NOW  . aspirin EC  81 mg Oral Daily  . atorvastatin  80 mg Oral Daily  . hydrochlorothiazide  25 mg Oral Daily  . levothyroxine  112 mcg Oral Daily   Continuous Infusions: . nitroGLYCERIN Stopped (05/12/19 0939)   PRN Meds: acetaminophen, nitroGLYCERIN, ondansetron (ZOFRAN) IV   Vital Signs    Vitals:   05/12/19 0730 05/12/19 0735 05/12/19 0800 05/12/19 0845  BP: (!) 149/84 (!) 146/90 (!) 148/95 (!) 159/88  Pulse:    (!) 57  Resp: 15 13 12 15   Temp:    98.5 F (36.9 C)  TempSrc:    Oral  SpO2:    100%  Weight:      Height:        Intake/Output Summary (Last 24 hours) at 05/12/2019 0943 Last data filed at 05/12/2019 0600 Gross per 24 hour  Intake 524.36 ml  Output 3600 ml  Net -3075.64 ml   Last 3 Weights 05/12/2019 05/11/2019 05/11/2019  Weight (lbs) 127 lb 11.2 oz 131 lb 2.8 oz 150 lb  Weight (kg) 57.924 kg 59.5 kg 68.04 kg      Telemetry    Sins rhythm 50-70s - Personally Reviewed  ECG    No new ECG- Personally Reviewed  Physical Exam   GEN: No acute distress.   Neck: No JVD Cardiac: RRR, no murmurs Respiratory: Clear to auscultation bilaterally. GI: Soft, nontender MS: No edema; No deformity. Neuro:  Nonfocal  Psych: Normal affect   Labs    High Sensitivity Troponin:   Recent Labs  Lab 05/11/19 1136 05/11/19 1323  TROPONINIHS 21* 27*      Chemistry Recent Labs  Lab 05/11/19 1136 05/12/19 0437  NA 142 140  K 3.5 3.5  CL 105 105  CO2 26 25    GLUCOSE 90 96  BUN 13 10  CREATININE 0.71 0.71  CALCIUM 9.1 9.0  GFRNONAA >60 >60  GFRAA >60 >60  ANIONGAP 11 10     Hematology Recent Labs  Lab 05/11/19 1136 05/12/19 0437  WBC 9.9 8.7  RBC 3.59* 3.26*  HGB 11.9* 10.8*  HCT 36.1 32.1*  MCV 100.6* 98.5  MCH 33.1 33.1  MCHC 33.0 33.6  RDW 13.7 13.8  PLT 344 301    BNPNo results for input(s): BNP, PROBNP in the last 168 hours.   DDimer No results for input(s): DDIMER in the last 168 hours.   Radiology    DG Chest 2 View  Result Date: 05/11/2019 CLINICAL DATA:  SOB, CP and increased BP on/off 6 months - pt having increased left side CP since yesterday, pain gets worse with exertion - hx of smoker, htn EXAM: CHEST - 2 VIEW COMPARISON:  05/29/2015 FINDINGS: Normal heart, mediastinum and hila. Lungs are hyperexpanded but clear. No pleural effusion or pneumothorax. Skeletal structures are intact. IMPRESSION: No active cardiopulmonary disease. Electronically Signed   By: 05/31/2015 M.D.   On: 05/11/2019 12:02   ECHOCARDIOGRAM COMPLETE  Result Date: 05/11/2019    ECHOCARDIOGRAM REPORT   Patient Name:   Debra [name] Date of Exam: 05/11/2019 Medical Rec #:  [id number]     Height:       64.0 in Accession #:    [id number]    Weight:       131.2 lb Date of Birth:  1955/06/12      BSA:          1.635 m Patient Age:    16 years      BP:           200/89 mmHg Patient Gender: F             HR:           72 bpm. Exam Location:  Inpatient Procedure: 2D Echo Indications:    Chest Pain 786.50 / R07.9  History:        Patient has no prior history of Echocardiogram examinations.                 Signs/Symptoms:Unstale angina; Risk Factors:Hypertension and                 Tobacco abuse.  Sonographer:    Jaquita Folds Referring Phys: [phone] JILL D MCDANIEL IMPRESSIONS  1. Left ventricular ejection fraction, by estimation, is 60 to 65%. The left ventricle has normal function. The left ventricle has no regional wall motion abnormalities. Left  ventricular diastolic parameters are consistent with Grade I diastolic dysfunction (impaired relaxation).  2. Right ventricular systolic function is normal. The right ventricular size is normal. Tricuspid regurgitation signal is inadequate for assessing PA pressure.  3. The mitral valve is normal in structure and function. No evidence of mitral valve regurgitation. No evidence of mitral stenosis.  4. The aortic valve is tricuspid. Aortic valve regurgitation is not visualized. Mild aortic valve sclerosis is present, with no evidence of aortic valve stenosis.  5. The inferior vena cava is normal in size with greater than 50% respiratory variability, suggesting right atrial pressure of 3 mmHg. FINDINGS  Left Ventricle: Left ventricular ejection fraction, by estimation, is 60 to 65%. The left ventricle has normal function. The left ventricle has no regional wall motion abnormalities. The left ventricular internal cavity size was normal in size. There is  no left ventricular hypertrophy. Left ventricular diastolic parameters are consistent with Grade I diastolic dysfunction (impaired relaxation). Normal left ventricular filling pressure. Right Ventricle: The right ventricular size is normal. No increase in right ventricular wall thickness. Right ventricular systolic function is normal. Tricuspid regurgitation signal is inadequate for assessing PA pressure. Left Atrium: Left atrial size was normal in size. Right Atrium: Right atrial size was normal in size. Pericardium: There is no evidence of pericardial effusion. Mitral Valve: The mitral valve is normal in structure and function. Normal mobility of the mitral valve leaflets. No evidence of mitral valve regurgitation. No evidence of mitral valve stenosis. Tricuspid Valve: The tricuspid valve is normal in structure. Tricuspid valve regurgitation is trivial. No evidence of tricuspid stenosis. Aortic Valve: The aortic valve is tricuspid. Aortic valve regurgitation is not  visualized. Mild aortic valve sclerosis is present, with no evidence of aortic valve stenosis. Aortic valve mean gradient measures 3.0 mmHg. Aortic valve peak gradient measures 6.9 mmHg. Aortic valve area, by VTI measures 2.97 cm. Pulmonic Valve: The pulmonic valve was normal in structure. Pulmonic valve regurgitation is not visualized. No evidence of pulmonic stenosis. Aorta: The aortic root is normal in size and  structure. Venous: The inferior vena cava is normal in size with greater than 50% respiratory variability, suggesting right atrial pressure of 3 mmHg. IAS/Shunts: No atrial level shunt detected by color flow Doppler.  LEFT VENTRICLE PLAX 2D LVIDd:         3.86 cm  Diastology LVIDs:         2.75 cm  LV e' lateral:   7.51 cm/s LV PW:         1.00 cm  LV E/e' lateral: 8.2 LV IVS:        1.03 cm  LV e' medial:    5.00 cm/s LVOT diam:     1.90 cm  LV E/e' medial:  12.3 LV SV:         67 LV SV Index:   41 LVOT Area:     2.84 cm  RIGHT VENTRICLE RV S prime:     17.90 cm/s TAPSE (M-mode): 2.3 cm LEFT ATRIUM           Index       RIGHT ATRIUM           Index LA diam:      2.60 cm 1.59 cm/m  RA Area:     11.50 cm LA Vol (A2C): 45.9 ml 28.07 ml/m RA Volume:   22.90 ml  14.00 ml/m LA Vol (A4C): 21.7 ml 13.27 ml/m  AORTIC VALVE AV Area (Vmax):    2.09 cm AV Area (Vmean):   2.10 cm AV Area (VTI):     2.97 cm AV Vmax:           131.00 cm/s AV Vmean:          84.000 cm/s AV VTI:            0.227 m AV Peak Grad:      6.9 mmHg AV Mean Grad:      3.0 mmHg LVOT Vmax:         96.60 cm/s LVOT Vmean:        62.200 cm/s LVOT VTI:          0.238 m LVOT/AV VTI ratio: 1.05  AORTA Ao Root diam: 2.70 cm MITRAL VALVE MV Area (PHT): 3.42 cm     SHUNTS MV Decel Time: 222 msec     Systemic VTI:  0.24 m MV E velocity: 61.30 cm/s   Systemic Diam: 1.90 cm MV A velocity: 104.00 cm/s MV E/A ratio:  0.59 Armanda Magic MD Electronically signed by Armanda Magic MD Signature Date/Time: 05/11/2019/6:09:24 PM    Final     Cardiac Studies     TTE 05/11/19:  1. Left ventricular ejection fraction, by estimation, is 60 to 65%. The  left ventricle has normal function. The left ventricle has no regional  wall motion abnormalities. Left ventricular diastolic parameters are  consistent with Grade I diastolic  dysfunction (impaired relaxation).  2. Right ventricular systolic function is normal. The right ventricular  size is normal. Tricuspid regurgitation signal is inadequate for assessing  PA pressure.  3. The mitral valve is normal in structure and function. No evidence of  mitral valve regurgitation. No evidence of mitral stenosis.  4. The aortic valve is tricuspid. Aortic valve regurgitation is not  visualized. Mild aortic valve sclerosis is present, with no evidence of  aortic valve stenosis.  5. The inferior vena cava is normal in size with greater than 50%  respiratory variability, suggesting right atrial pressure of 3 mmHg.   Patient Profile  64 y.o. female with a hx of hypothyroidism, hyperlipidemia, tobacco use and hypertension who presents with chest pain  Assessment & Plan     1. Chest pain: atypical in description, as describes intermittent sharp left sided pain, though can occur with exertion.  High-sensitivity troponin found to be mildly elevated at 21>>27 however likely demand ischemia in the setting of markedly elevated BP.  TTE shows normal LV systolic function -Coronary CTA today  2.  Hypertensive urgency: BPs on presentation found to be markedly elevated at 183/92, 200/97, 194/91, 200/99, 190/95.  Patient states she was previously on HCTZ however has been without her antihypertensive for quite some time secondary to problems with insurance and PCP providers. -Restarted HCTZ at 25 mg and add amlodipine, titrating NTG off  3.  Hyperlipidemia: LDL 62.  Continue high intensity atorvastatin 80 mg p.o. daily  4.  Tobacco use: Reports approximately 3 cigarettes/day.  Cessation strongly  encouraged    For questions or updates, please contact [facility] Please consult [email or web address] for contact info under        Signed, Little Ishikawa, MD  05/12/2019, 9:43 AM

## 2019-05-12 NOTE — Progress Notes (Signed)
   Vital Signs MEWS/VS Documentation      05/12/2019 0727 05/12/2019 0730 05/12/2019 0735 05/12/2019 0800   MEWS Score:  0  0  1  1   MEWS Score Color:  Green  Green  Green  Green   Resp:  15  15  13  12    BP:  (!) 157/81  (!) 149/84  (!) 146/90  (!) 148/95     Came onto shift, patient's BP was elevated. Began titrating nitro gtt and paged cards. Received orders for metoprolol and increased dose of norvasc, plan to wean nitro and control BP with PO meds.       [place] 05/12/2019,8:04 AM

## 2019-05-12 NOTE — Progress Notes (Signed)
Patient discharged to home, AVS was reviewed with the patient and all questions answered. Patient confirmed prescriptions were sent to the correct pharmacy. Patient was assisted to the exit by staff and patient provided her own transportation.

## 2019-05-12 NOTE — Discharge Summary (Addendum)
Discharge Summary    Patient ID: Debra [name],  MRN: [id number], DOB/AGE: [birth year] 64 y.o.  Admit date: 05/11/2019 Discharge date: 05/12/2019  Primary Care Provider: Laruth Bouchard Primary Cardiologist: Little Ishikawa, MD  Discharge Diagnoses    Principal Problem:   Chest pain Active Problems:   Unstable angina [facility])   Elevated troponin   Hypertensive urgency   Demand ischemia (HCC)   Allergies No Known Allergies  Diagnostic Studies/Procedures    Coronary CTA 05/12/19: IMPRESSION: 1. Coronary calcium score of 0. This was 0 percentile for age and sex matched control.  2. Nonobstructive CAD. Noncalcified plaques causing up to minimal (0-24%) stenosis in the proximal LAD and mild (25-49%) stenosis in the mid LCX  CAD-RADS 2. Mild non-obstructive CAD (25-49%). Consider non-atherosclerotic causes of chest pain. Consider preventive therapy and risk factor modification.  Echocardiogram 05/11/19: 1. Left ventricular ejection fraction, by estimation, is 60 to 65%. The  left ventricle has normal function. The left ventricle has no regional  wall motion abnormalities. Left ventricular diastolic parameters are  consistent with Grade I diastolic  dysfunction (impaired relaxation).  2. Right ventricular systolic function is normal. The right ventricular  size is normal. Tricuspid regurgitation signal is inadequate for assessing  PA pressure.  3. The mitral valve is normal in structure and function. No evidence of  mitral valve regurgitation. No evidence of mitral stenosis.  4. The aortic valve is tricuspid. Aortic valve regurgitation is not  visualized. Mild aortic valve sclerosis is present, with no evidence of  aortic valve stenosis.  5. The inferior vena cava is normal in size with greater than 50%  respiratory variability, suggesting right atrial pressure of 3 mmHg.   _____________   History of Present Illness     Debra [name] is a 64yo F with a hx of  hypothyroidism, hyperlipidemia, tobacco use and hypertension who is being admitted to [facility] for the evaluation of chest pain. She presented to an urgent care center with complaints of squeezing, sharp left-sided chest pain which was noted to be intermittent, reported as a 10 out of 10 in intensity.  Patient stated that her symptoms began several months ago as intermittent chest pains occurring mostly at work.  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  She states that more recently her symptoms have become more frequent with increased intensity.  She states that she is always on the go at work and will notice that the pains will start very dull and as she increases her exertion, they become more intense.  Chest pain starts near her left sided nipple line and will radiate slightly up anteriorly.  She has some associated nausea however does correlate some of that to her second dose of Covid vaccine.  She states that she was working last night with symptoms as described above at which time her coworkers recommended that she proceed to an urgent care or the ED for further work-up.  She initially presented to an urgent care center where an EKG was performed which showed no acute changes with T wave inversions in leads V1 and V2, similar to prior tracings. Given her CRFs of hyperlipidemia, hypertension and tobacco use it was recommended that she proceed to the emergency department for further evaluation.  Initially, she was reluctant to do so however eventually became agreeable.  On ED presentation, high-sensitivity troponin found to be mildly elevated at 21 with a repeat of 27.  BP found to  be markedly elevated with SBP's in the one eighty to low two hundred range.  She states that she was previously well established with a PCP however given multiple insurance changes with her job she has not found a stable PCP for a while.  She states she has been taking her statin and hypothyroid  medication however has not been taking any antihypertensives. Creatinine at 0.71 with all other lab work essentially within normal limits. CXR showed no active cardiopulmonary disease. She has never undergone any cardiac assessment including stress testing or cardiac catheterization.  She has no family history of reported CAD.  Does state she had an uncle and a son who both died secondary to pulmonary emboli.  Given her mildly elevated troponin, IV heparin infusion was initiated for presumed ACS as well as NTG gtt given markedly elevated BPs on presentation of 200/99, 184/90, 194/91.  As above, has not been taking her HCTZ for BP control however reports taking her statin.  She continues to have mild chest pressure.  She denies shortness of breath, LE edema, orthopnea, PND, dizziness, recent cough, fatigue, palpitations or syncope.  Hospital Course     Consultants: None   1. Non-obstructive CAD: patient presented with complaints of intermittent exertional chest pain. HsTrop was minimally elevated to 21>27. EKG with TWI in V1-2, no STE/D. BP was markedly elevated on arrival which was felt to be the crux of the issue. She had an echocardiogram which showed EF 60-65%, G1DD, no RWMA, and no significant valvular abnormalities. She underwent a coronary CTA which revealed 0-24% pLAD stenosis and 25-49% mLCx stenosis. Chest pain improved with management of her HTN.  - Continue aspirin - Continue aggressive risk factor modifications below  2. Hypertensive urgency: BP markedly elevated on arrival. Nitro gtt started and weaned off. Started on amlodipine 10mg  daily and hydrochlorothiazide 25mg  daily. BP improved.  Goal BP <130/80 - Continue amlodipine and HCTZ  3. HLD: LDL 62 this admission; at goal of <70 - Continue atorvastatin 80mg  daily  4. Tobacco abuse: smoking a few cigarettes/day - Smoking cessation strongly encouraged this admission.  _____________  Discharge Vitals Blood pressure (!) 142/78,  pulse 65, temperature 98.9 F (37.2 C), temperature source Oral, resp. rate 15, height 5\' 4"  (1.626 m), weight 57.9 kg, SpO2 100 %.  Filed Weights   05/11/19 1300 05/11/19 1639 05/12/19 0520  Weight: 68 kg 59.5 kg 57.9 kg    Labs & Radiologic Studies    CBC Recent Labs    05/11/19 1136 05/12/19 0437  WBC 9.9 8.7  HGB 11.9* 10.8*  HCT 36.1 32.1*  MCV 100.6* 98.5  PLT 344 301   Basic Metabolic Panel Recent Labs    05/13/19 1136 05/12/19 0437  NA 142 140  K 3.5 3.5  CL 105 105  CO2 26 25  GLUCOSE 90 96  BUN 13 10  CREATININE 0.71 0.71  CALCIUM 9.1 9.0   Liver Function Tests No results for input(s): AST, ALT, ALKPHOS, BILITOT, PROT, ALBUMIN in the last 72 hours. No results for input(s): LIPASE, AMYLASE in the last 72 hours. Cardiac Enzymes No results for input(s): CKTOTAL, CKMB, CKMBINDEX, TROPONINI in the last 72 hours. BNP Invalid input(s): POCBNP D-Dimer No results for input(s): DDIMER in the last 72 hours. Hemoglobin A1C Recent Labs    05/11/19 1918  HGBA1C 5.3   Fasting Lipid Panel Recent Labs    05/12/19 0437  CHOL 128  HDL 59  LDLCALC 62  TRIG 35  CHOLHDL 2.2  Thyroid Function Tests No results for input(s): TSH, T4TOTAL, T3FREE, THYROIDAB in the last 72 hours.  Invalid input(s): FREET3 _____________  DG Chest 2 View  Result Date: 05/11/2019 CLINICAL DATA:  SOB, CP and increased BP on/off 6 months - pt having increased left side CP since yesterday, pain gets worse with exertion - hx of smoker, htn EXAM: CHEST - 2 VIEW COMPARISON:  05/29/2015 FINDINGS: Normal heart, mediastinum and hila. Lungs are hyperexpanded but clear. No pleural effusion or pneumothorax. Skeletal structures are intact. IMPRESSION: No active cardiopulmonary disease. Electronically Signed   By: Amie Portland M.D.   On: 05/11/2019 12:02   CT CORONARY MORPH W/CTA COR W/SCORE W/CA W/CM &/OR WO/CM  Addendum Date: 05/12/2019   ADDENDUM REPORT: 05/12/2019 12:18 CLINICAL DATA:  61F  presents with chest pain EXAM: Cardiac/Coronary CTA TECHNIQUE: The patient was scanned on a [employer]. FINDINGS: A 100 kV prospective scan was triggered in the descending thoracic aorta at 111 HU's. Axial non-contrast 3 mm slices were carried out through the heart. The data set was analyzed on a dedicated work station and scored using the Agatson method. Gantry rotation speed was 250 msecs and collimation was .6 mm. No beta blockade and 0.8 mg of sl NTG was given. The 3D data set was reconstructed in 5% intervals of the 67-82 % of the R-R cycle. Diastolic phases were analyzed on a dedicated work station using MPR, MIP and VRT modes. The patient received 80 cc of contrast. Coronary Arteries:  Normal coronary origin.  Left dominant RCA is small (72mm) and nondominant. Left main is a large artery that gives rise to LAD and LCX arteries. LAD is a large vessel. There is noncalcified plaque in the proximal LAD causing minimal (0-24%) stenosis. LCX is a dominant artery. There is noncalcified plaque in the mid LCX causing mild (25-49%) stenosis Other findings: Left Ventricle: Normal size Left Atrium: Mild enlargement Pulmonary Veins: Normal configuration Right Ventricle: Normal size Right Atrium: Normal size Cardiac valves: No calcifications Thoracic aorta: Normal size Pulmonary Arteries: Normal size Systemic Veins: Normal drainage Pericardium: Normal thickness IMPRESSION: 1. Coronary calcium score of 0. This was 0 percentile for age and sex matched control. 2. Nonobstructive CAD. Noncalcified plaques causing up to minimal (0-24%) stenosis in the proximal LAD and mild (25-49%) stenosis in the mid LCX CAD-RADS 2. Mild non-obstructive CAD (25-49%). Consider non-atherosclerotic causes of chest pain. Consider preventive therapy and risk factor modification. Electronically Signed   By: Epifanio Lesches MD   On: 05/12/2019 12:18   Result Date: 05/12/2019 EXAM: OVER-READ INTERPRETATION  CT CHEST The following  report is an over-read performed by radiologist Dr. Trudie Reed of [facility] on 05/12/2019. This over-read does not include interpretation of cardiac or coronary anatomy or pathology. The coronary calcium score/coronary CTA interpretation by the cardiologist is attached. COMPARISON:  None. FINDINGS: Within the visualized portions of the thorax there are no suspicious appearing pulmonary nodules or masses, there is no acute consolidative airspace disease, no pleural effusions, no pneumothorax and no lymphadenopathy. Visualized portions of the upper abdomen are unremarkable. There are no aggressive appearing lytic or blastic lesions noted in the visualized portions of the skeleton. IMPRESSION: No significant incidental noncardiac findings are noted. Electronically Signed: By: Trudie Reed M.D. On: 05/12/2019 11:31   ECHOCARDIOGRAM COMPLETE  Result Date: 05/11/2019    ECHOCARDIOGRAM REPORT   Patient Name:   Debra [name] Date of Exam: 05/11/2019 Medical Rec #:  [id number]     Height:  64.0 in Accession #:    [id number]    Weight:       131.2 lb Date of Birth:  14-Aug-1955      BSA:          1.635 m Patient Age:    63 years      BP:           200/89 mmHg Patient Gender: F             HR:           72 bpm. Exam Location:  Inpatient Procedure: 2D Echo Indications:    Chest Pain 786.50 / R07.9  History:        Patient has no prior history of Echocardiogram examinations.                 Signs/Symptoms:Unstale angina; Risk Factors:Hypertension and                 Tobacco abuse.  Sonographer:    Leda Min Referring Phys: [id number] JILL D MCDANIEL IMPRESSIONS  1. Left ventricular ejection fraction, by estimation, is 60 to 65%. The left ventricle has normal function. The left ventricle has no regional wall motion abnormalities. Left ventricular diastolic parameters are consistent with Grade I diastolic dysfunction (impaired relaxation).  2. Right ventricular systolic function is normal. The right  ventricular size is normal. Tricuspid regurgitation signal is inadequate for assessing PA pressure.  3. The mitral valve is normal in structure and function. No evidence of mitral valve regurgitation. No evidence of mitral stenosis.  4. The aortic valve is tricuspid. Aortic valve regurgitation is not visualized. Mild aortic valve sclerosis is present, with no evidence of aortic valve stenosis.  5. The inferior vena cava is normal in size with greater than 50% respiratory variability, suggesting right atrial pressure of 3 mmHg. FINDINGS  Left Ventricle: Left ventricular ejection fraction, by estimation, is 60 to 65%. The left ventricle has normal function. The left ventricle has no regional wall motion abnormalities. The left ventricular internal cavity size was normal in size. There is  no left ventricular hypertrophy. Left ventricular diastolic parameters are consistent with Grade I diastolic dysfunction (impaired relaxation). Normal left ventricular filling pressure. Right Ventricle: The right ventricular size is normal. No increase in right ventricular wall thickness. Right ventricular systolic function is normal. Tricuspid regurgitation signal is inadequate for assessing PA pressure. Left Atrium: Left atrial size was normal in size. Right Atrium: Right atrial size was normal in size. Pericardium: There is no evidence of pericardial effusion. Mitral Valve: The mitral valve is normal in structure and function. Normal mobility of the mitral valve leaflets. No evidence of mitral valve regurgitation. No evidence of mitral valve stenosis. Tricuspid Valve: The tricuspid valve is normal in structure. Tricuspid valve regurgitation is trivial. No evidence of tricuspid stenosis. Aortic Valve: The aortic valve is tricuspid. Aortic valve regurgitation is not visualized. Mild aortic valve sclerosis is present, with no evidence of aortic valve stenosis. Aortic valve mean gradient measures 3.0 mmHg. Aortic valve peak gradient  measures 6.9 mmHg. Aortic valve area, by VTI measures 2.97 cm. Pulmonic Valve: The pulmonic valve was normal in structure. Pulmonic valve regurgitation is not visualized. No evidence of pulmonic stenosis. Aorta: The aortic root is normal in size and structure. Venous: The inferior vena cava is normal in size with greater than 50% respiratory variability, suggesting right atrial pressure of 3 mmHg. IAS/Shunts: No atrial level shunt detected by color flow Doppler.  LEFT VENTRICLE PLAX  2D LVIDd:         3.86 cm  Diastology LVIDs:         2.75 cm  LV e' lateral:   7.51 cm/s LV PW:         1.00 cm  LV E/e' lateral: 8.2 LV IVS:        1.03 cm  LV e' medial:    5.00 cm/s LVOT diam:     1.90 cm  LV E/e' medial:  12.3 LV SV:         67 LV SV Index:   41 LVOT Area:     2.84 cm  RIGHT VENTRICLE RV S prime:     17.90 cm/s TAPSE (M-mode): 2.3 cm LEFT ATRIUM           Index       RIGHT ATRIUM           Index LA diam:      2.60 cm 1.59 cm/m  RA Area:     11.50 cm LA Vol (A2C): 45.9 ml 28.07 ml/m RA Volume:   22.90 ml  14.00 ml/m LA Vol (A4C): 21.7 ml 13.27 ml/m  AORTIC VALVE AV Area (Vmax):    2.09 cm AV Area (Vmean):   2.10 cm AV Area (VTI):     2.97 cm AV Vmax:           131.00 cm/s AV Vmean:          84.000 cm/s AV VTI:            0.227 m AV Peak Grad:      6.9 mmHg AV Mean Grad:      3.0 mmHg LVOT Vmax:         96.60 cm/s LVOT Vmean:        62.200 cm/s LVOT VTI:          0.238 m LVOT/AV VTI ratio: 1.05  AORTA Ao Root diam: 2.70 cm MITRAL VALVE MV Area (PHT): 3.42 cm     SHUNTS MV Decel Time: 222 msec     Systemic VTI:  0.24 m MV E velocity: 61.30 cm/s   Systemic Diam: 1.90 cm MV A velocity: 104.00 cm/s MV E/A ratio:  0.59 Armanda Magicraci Turner MD Electronically signed by Armanda Magicraci Turner MD Signature Date/Time: 05/11/2019/6:09:24 PM    Final    Disposition   Patient was seen and examined by Dr. Bjorn PippinSchumann who deemed patient as stable for discharge. Follow-up has been arranged. Discharge medications as listed below.    Follow-up Plans & Appointments    Follow-up Information    Little IshikawaSchumann, Brier Firebaugh L, MD Follow up on 05/30/2019.   Specialty: Cardiology Why: Please arrive 15 minutes early for 2pm post-hospital cardiology follow-up appointment.  Contact information: [address] [place] [address] [phone]            Discharge Medications   Allergies as of 05/12/2019   No Known Allergies     Medication List    STOP taking these medications   meloxicam 15 MG tablet Commonly known as: MOBIC     TAKE these medications   acetaminophen 650 MG CR tablet Commonly known as: TYLENOL Take 1,300 mg by mouth every 8 (eight) hours as needed for pain.   amLODipine 10 MG tablet Commonly known as: NORVASC Take 1 tablet (10 mg total) by mouth daily. Start taking on: May 13, 2019   aspirin 81 MG tablet Take 81 mg by mouth daily.   atorvastatin 80  MG tablet Commonly known as: LIPITOR Take 1 tablet (80 mg total) by mouth daily.   clotrimazole-betamethasone cream Commonly known as: LOTRISONE Apply 1 application topically 2 (two) times daily.   fluticasone 50 MCG/ACT nasal spray Commonly known as: FLONASE Place 2 sprays into both nostrils daily.   gabapentin 100 MG capsule Commonly known as: NEURONTIN Take 1 capsule (100 mg total) by mouth 3 (three) times daily.   hydrochlorothiazide 25 MG tablet Commonly known as: HYDRODIURIL Take 1 tablet (25 mg total) by mouth daily. Start taking on: May 13, 2019 What changed:   medication strength  how much to take  Another medication with the same name was removed. Continue taking this medication, and follow the directions you see here.   HYDROcodone-acetaminophen 5-325 MG tablet Commonly known as: Norco Take 1 tablet by mouth every 6 (six) hours as needed for moderate pain.   ibuprofen 800 MG tablet Commonly known as: ADVIL Take 800 mg by mouth every 6 (six) hours as needed.   levothyroxine 112 MCG  tablet Commonly known as: SYNTHROID Take 1 tablet (112 mcg total) by mouth daily.   loratadine 10 MG tablet Commonly known as: CLARITIN Take 1 tablet (10 mg total) by mouth daily.   mometasone 50 MCG/ACT nasal spray Commonly known as: Nasonex Place 2 sprays into the nose daily.   PARoxetine 20 MG tablet Commonly known as: PAXIL Take 1 tablet (20 mg total) by mouth daily.   traZODone 50 MG tablet Commonly known as: DESYREL Take 0.5-1 tablets (25-50 mg total) by mouth at bedtime as needed for sleep.   valACYclovir 1000 MG tablet Commonly known as: Valtrex Take 1 tablet (1,000 mg total) by mouth 3 (three) times daily.   zolpidem 5 MG tablet Commonly known as: Ambien Take 1 tablet (5 mg total) by mouth at bedtime as needed for sleep.          Outstanding Labs/Studies   None  Duration of Discharge Encounter   Greater than 30 minutes including physician time.  Signed, Abigail Butts PA-C 05/12/2019, 1:13 PM

## 2019-05-12 NOTE — Discharge Instructions (Signed)
Please keep a log of your blood pressures (morning and evening) to bring to your follow-up appointment to help determine if additional medication changes need to be made.

## 2019-05-28 NOTE — Progress Notes (Signed)
Cardiology Office Note:    Date:  05/30/2019   ID:  Marlynn Perking, DOB [birth year], MRN [id number]  PCP:  Vassie Moment, MD  Cardiologist:  Donato Heinz, MD  Electrophysiologist:  None   Referring MD: Vassie Moment, MD   Chief Complaint  Patient presents with  . Chest Pain    History of Present Illness:    Debra [name] is a 64 y.o. female with a hx of hypertension, hyperlipidemia, hypothyroidism, tobacco use who presents as a hospital follow-up following recent admission for hypertensive urgency.  She presented to [facility] on 05/11/2019.  She reports she had been off her antihypertensives for over 6 months.  She states that she has been having intermittent chest pain.  Reported sharp left-sided chest pain with exertion that resolves with rest.  Has been occurring about once per week.  Day prior to admission she had an episode of chest pain while working.  She went to urgent care and given her risk factors recommended to go to the ED.  In the ED, she was found to be hypertensive to 200s over 90s.  High-sensitivity troponin 21 then 27.  EKG showed no ST abnormalities.  She was started on nitroglycerin drip for her hypertension.  Her home hydrochlorothiazide was restarted and amlodipine was added, and she was able to be weaned off the nitro drip.  TTE on 05/11/2019 showed normal LV systolic function, grade 1 diastolic dysfunction, normal RV systolic function, no significant valvular disease.  Coronary CTA 05/12/19 showed nonobstructive CAD, with noncalcified plaques causing minimal stenosis in the proximal LAD and mild stenosis in the mid circumflex.  Calcium score was 0.  Since discharge from the hospital, she reports she has been doing well reports.. Reports compliance with taking his BP meds.  She brought her BP log with her today, has been 110s over 70s up to 160s over 110s.  Reports she does continue to have occasional chest pain.  Only occurs with significant exertion when she is at work.  Can  last up to 30 minutes, resolves with rest.  She continues to smoke, smoking 2 to 3 cigarettes/day.    Past Medical History:  Diagnosis Date  . Allergy   . Arthritis   . Hypertension   . Hypothyroidism   . Reflux   . Tobacco abuse     Past Surgical History:  Procedure Laterality Date  . LACERATION REPAIR Right 2008   arm; severe laceration requiring OR repair and PT   . tubal ligation Bilateral   . TUBAL LIGATION      Current Medications: Current Meds  Medication Sig  . acetaminophen (TYLENOL) 650 MG CR tablet Take 1,300 mg by mouth every 8 (eight) hours as needed for pain.  Marland Kitchen amLODipine (NORVASC) 10 MG tablet Take 1 tablet (10 mg total) by mouth daily.  Marland Kitchen aspirin 81 MG tablet Take 81 mg by mouth daily.  Marland Kitchen atorvastatin (LIPITOR) 80 MG tablet Take 1 tablet (80 mg total) by mouth daily.  . hydrochlorothiazide (HYDRODIURIL) 25 MG tablet Take 1 tablet (25 mg total) by mouth daily.  Marland Kitchen ibuprofen (ADVIL) 800 MG tablet Take 800 mg by mouth every 6 (six) hours as needed.  Marland Kitchen levothyroxine (SYNTHROID) 112 MCG tablet Take 1 tablet (112 mcg total) by mouth daily.     Allergies:   Patient has no known allergies.   Social History   Socioeconomic History  . Marital status: Single    Spouse name: Not on file  . Number  of children: Not on file  . Years of education: Not on file  . Highest education level: Not on file  Occupational History  . Not on file  Tobacco Use  . Smoking status: Current Every Day Smoker    Packs/day: 0.10    Years: 30.00    Pack years: 3.00    Types: Cigarettes  . Smokeless tobacco: Never Used  . Tobacco comment: working on quitting - down to 3 a day (02/25/17)  Substance and Sexual Activity  . Alcohol use: Yes    Comment: occasional: twice a year  . Drug use: No  . Sexual activity: Not on file  Other Topics Concern  . Not on file  Social History Narrative  . Not on file   Social Determinants of Health   Financial Resource Strain:   . Difficulty  of Paying Living Expenses:   Food Insecurity:   . Worried About Programme researcher, broadcasting/film/video in the Last Year:   . Barista in the Last Year:   Transportation Needs:   . Freight forwarder (Medical):   Marland Kitchen Lack of Transportation (Non-Medical):   Physical Activity:   . Days of Exercise per Week:   . Minutes of Exercise per Session:   Stress:   . Feeling of Stress :   Social Connections:   . Frequency of Communication with Friends and Family:   . Frequency of Social Gatherings with Friends and Family:   . Attends Religious Services:   . Active Member of Clubs or Organizations:   . Attends Banker Meetings:   Marland Kitchen Marital Status:      Family History: The patient's family history includes Cancer in her mother and sister; Gallstones in her mother; Hypertension in her mother; Stroke (age of onset: 74) in her mother; Thyroid disease in her brother. There is no history of Colon cancer.  ROS:   Please see the history of present illness.     All other systems reviewed and are negative.  EKGs/Labs/Other Studies Reviewed:    The following studies were reviewed today:  EKG:  EKG is not ordered today.  TTE 05/11/19: 1. Left ventricular ejection fraction, by estimation, is 60 to 65%. The  left ventricle has normal function. The left ventricle has no regional  wall motion abnormalities. Left ventricular diastolic parameters are  consistent with Grade I diastolic  dysfunction (impaired relaxation).  2. Right ventricular systolic function is normal. The right ventricular  size is normal. Tricuspid regurgitation signal is inadequate for assessing  PA pressure.  3. The mitral valve is normal in structure and function. No evidence of  mitral valve regurgitation. No evidence of mitral stenosis.  4. The aortic valve is tricuspid. Aortic valve regurgitation is not  visualized. Mild aortic valve sclerosis is present, with no evidence of  aortic valve stenosis.  5. The inferior  vena cava is normal in size with greater than 50%  respiratory variability, suggesting right atrial pressure of 3 mmHg.   Coronary CTA 05/12/19: 1. Coronary calcium score of 0. This was 0 percentile for age and sex matched control.  2. Nonobstructive CAD. Noncalcified plaques causing up to minimal (0-24%) stenosis in the proximal LAD and mild (25-49%) stenosis in the mid LCX  CAD-RADS 2. Mild non-obstructive CAD (25-49%). Consider non-atherosclerotic causes of chest pain. Consider preventive therapy and risk factor modification.  No significant incidental noncardiac findings are noted.  Recent Labs: 06/30/2018: TSH 1.328 05/12/2019: BUN 10; Creatinine, Ser 0.71; Hemoglobin  10.8; Platelets 301; Potassium 3.5; Sodium 140  Recent Lipid Panel    Component Value Date/Time   CHOL 128 05/12/2019 0437   CHOL 173 11/04/2016 0950   TRIG 35 05/12/2019 0437   HDL 59 05/12/2019 0437   HDL 57 11/04/2016 0950   CHOLHDL 2.2 05/12/2019 0437   VLDL 7 05/12/2019 0437   LDLCALC 62 05/12/2019 0437   LDLCALC 105 (H) 11/04/2016 0950    Physical Exam:    VS:  BP 130/70   Pulse 86   Temp 98.2 F (36.8 C)   Ht 5\' 4"  (1.626 m)   Wt 132 lb (59.9 kg)   SpO2 99%   BMI 22.66 kg/m     Wt Readings from Last 3 Encounters:  05/30/19 132 lb (59.9 kg)  05/12/19 127 lb 11.2 oz (57.9 kg)  07/04/18 150 lb (68 kg)     GEN:  Well nourished, well developed in no acute distress HEENT: Normal NECK: No JVD CARDIAC: RRR, no murmurs, rubs, gallops RESPIRATORY:  Clear to auscultation without rales, wheezing or rhonchi  ABDOMEN: Soft, non-tender, non-distended MUSCULOSKELETAL:  No edema; No deformity  SKIN: Warm and dry NEUROLOGIC:  Alert and oriented x 3 PSYCHIATRIC:  Normal affect   ASSESSMENT:    1. Coronary artery disease involving native coronary artery of native heart without angina pectoris   2. Chest pain of uncertain etiology   3. Essential hypertension   4. Medication management   5.  Hypothyroidism, unspecified type    PLAN:     CAD: Presented with typical angina symptoms, coronary CTA 05/12/19 showed noncalcified plaques causing minimal stenosis in the proximal LAD and mild stenosis in the mid circumflex.  Calcium score was 0.  TTE on 05/11/2019 showed normal LV systolic function, grade 1 diastolic dysfunction, normal RV systolic function, no significant valvular disease.  -Given symptoms of typical angina with nonobstructive CAD, concerning for microvascular disease.  Will start carvedilol 6.25 mg twice daily -Continue atorvastatin 80 mg daily  Hypertension: On hydrochlorothiazide 25 mg daily and amlodipine 10 mg daily.  Appears controlled in clinic today, though on patient's BP log has been up to 160s over 110s.  We will add carvedilol 6.25 mg twice daily as above.  Asked patient to check BP daily for next 2 weeks and call with results.  Also asked patient to bring her blood pressure cuff to upcoming appointment with PCP to verify accurate measurements.  Will check BMP/magnesium since recently restarted HCTZ  Hyperlipidemia: On atorvastatin 80 mg daily.  LDL 62 on 05/12/2019  Tobacco use: Currently smoking 2 to 3 cigarettes/day.  Patient was counseled on risks of tobacco use and cessation was strongly encouraged.  Hypothyroidism: On levothyroxine 112 mcg daily.  She reports recent weight loss, will check TSH.  RTC in 3 months   Medication Adjustments/Labs and Tests Ordered: Current medicines are reviewed at length with the patient today.  Concerns regarding medicines are outlined above.  Orders Placed This Encounter  Procedures  . Basic metabolic panel  . Magnesium  . TSH   Meds ordered this encounter  Medications  . carvedilol (COREG) 6.25 MG tablet    Sig: Take 1 tablet (6.25 mg total) by mouth 2 (two) times daily.    Dispense:  180 tablet    Refill:  3    Patient Instructions  Medication Instructions:  START carvedilol (Coreg) 6.25 mg two times  daily  *If you need a refill on your cardiac medications before your next appointment, please call  your pharmacy*   Lab Work: Today (BMET, Mag, [facility])  If you have labs (blood work) drawn today and your tests are completely normal, you will receive your results only by: Marland Kitchen MyChart Message (if you have MyChart) OR . A paper copy in the mail If you have any lab test that is abnormal or we need to change your treatment, we will call you to review the results.   Testing/Procedures: NONE  Follow-Up: At [facility], you and your health needs are our priority.  As part of our continuing mission to provide you with exceptional heart care, we have created designated Provider Care Teams.  These Care Teams include your primary Cardiologist (physician) and Advanced Practice Providers (APPs -  Physician Assistants and Nurse Practitioners) who all work together to provide you with the care you need, when you need it.  We recommend signing up for the patient portal called "MyChart".  Sign up information is provided on this After Visit Summary.  MyChart is used to connect with patients for Virtual Visits (Telemedicine).  Patients are able to view lab/test results, encounter notes, upcoming appointments, etc.  Non-urgent messages can be sent to your provider as well.   To learn more about what you can do with MyChart, go to [email or web address].    Your next appointment:   3 month(s)  The format for your next appointment:   In Person  Provider:   Epifanio Lesches, MD   Other Instructions Check blood pressure daily at home and write it down-call in 2 weeks to report readings for Dr. Bjorn Pippin to review.      Signed, Little Ishikawa, MD  05/30/2019 5:51 PM    [facility]

## 2019-05-30 ENCOUNTER — Ambulatory Visit (INDEPENDENT_AMBULATORY_CARE_PROVIDER_SITE_OTHER): Payer: PRIVATE HEALTH INSURANCE | Admitting: Cardiology

## 2019-05-30 ENCOUNTER — Encounter: Payer: Self-pay | Admitting: Cardiology

## 2019-05-30 ENCOUNTER — Other Ambulatory Visit: Payer: Self-pay

## 2019-05-30 VITALS — BP 130/70 | HR 86 | Temp 98.2°F | Ht 64.0 in | Wt 132.0 lb

## 2019-05-30 DIAGNOSIS — I2510 Atherosclerotic heart disease of native coronary artery without angina pectoris: Secondary | ICD-10-CM | POA: Diagnosis not present

## 2019-05-30 DIAGNOSIS — I10 Essential (primary) hypertension: Secondary | ICD-10-CM

## 2019-05-30 DIAGNOSIS — Z79899 Other long term (current) drug therapy: Secondary | ICD-10-CM

## 2019-05-30 DIAGNOSIS — R079 Chest pain, unspecified: Secondary | ICD-10-CM | POA: Diagnosis not present

## 2019-05-30 DIAGNOSIS — E039 Hypothyroidism, unspecified: Secondary | ICD-10-CM

## 2019-05-30 MED ORDER — CARVEDILOL 6.25 MG PO TABS: 6.2500 mg | ORAL_TABLET | Freq: Two times a day (BID) | ORAL | 3 refills | Status: AC

## 2019-05-30 NOTE — Patient Instructions (Signed)
Medication Instructions:  START carvedilol (Coreg) 6.25 mg two times daily  *If you need a refill on your cardiac medications before your next appointment, please call your pharmacy*   Lab Work: Today (BMET, Mag, TSH)  If you have labs (blood work) drawn today and your tests are completely normal, you will receive your results only by: Marland Kitchen MyChart Message (if you have MyChart) OR . A paper copy in the mail If you have any lab test that is abnormal or we need to change your treatment, we will call you to review the results.   Testing/Procedures: NONE  Follow-Up: At [facility], you and your health needs are our priority.  As part of our continuing mission to provide you with exceptional heart care, we have created designated Provider Care Teams.  These Care Teams include your primary Cardiologist (physician) and Advanced Practice Providers (APPs -  Physician Assistants and Nurse Practitioners) who all work together to provide you with the care you need, when you need it.  We recommend signing up for the patient portal called "MyChart".  Sign up information is provided on this After Visit Summary.  MyChart is used to connect with patients for Virtual Visits (Telemedicine).  Patients are able to view lab/test results, encounter notes, upcoming appointments, etc.  Non-urgent messages can be sent to your provider as well.   To learn more about what you can do with MyChart, go to [email or web address].    Your next appointment:   3 month(s)  The format for your next appointment:   In Person  Provider:   Epifanio Lesches, MD   Other Instructions Check blood pressure daily at home and write it down-call in 2 weeks to report readings for Dr. Bjorn Pippin to review.

## 2019-05-31 ENCOUNTER — Encounter: Payer: Self-pay | Admitting: *Deleted

## 2019-05-31 LAB — BASIC METABOLIC PANEL
BUN/Creatinine Ratio: 24 (ref 12–28)
BUN: 17 mg/dL (ref 8–27)
CO2: 27 mmol/L (ref 20–29)
Calcium: 9.9 mg/dL (ref 8.7–10.3)
Chloride: 99 mmol/L (ref 96–106)
Creatinine, Ser: 0.72 mg/dL (ref 0.57–1.00)
GFR calc Af Amer: 103 mL/min/{1.73_m2} (ref >59)
GFR calc non Af Amer: 89 mL/min/{1.73_m2} (ref >59)
Glucose: 80 mg/dL (ref 65–99)
Potassium: 4.1 mmol/L (ref 3.5–5.2)
Sodium: 141 mmol/L (ref 134–144)

## 2019-05-31 LAB — MAGNESIUM: Magnesium: 1.9 mg/dL (ref 1.6–2.3)

## 2019-05-31 LAB — TSH: TSH: 0.451 u[IU]/mL (ref 0.450–4.500)

## 2020-06-08 ENCOUNTER — Other Ambulatory Visit: Payer: Self-pay | Admitting: Medical

## 2020-06-10 NOTE — Telephone Encounter (Signed)
This is Dr. Schumann's pt 

## 2021-09-18 ENCOUNTER — Ambulatory Visit: Payer: Self-pay

## 2021-09-18 ENCOUNTER — Other Ambulatory Visit: Payer: Self-pay | Admitting: Nurse Practitioner

## 2021-09-18 DIAGNOSIS — M25562 Pain in left knee: Secondary | ICD-10-CM

## 2021-11-21 ENCOUNTER — Other Ambulatory Visit: Payer: Self-pay | Admitting: Family Medicine

## 2021-11-21 ENCOUNTER — Ambulatory Visit
Admission: RE | Admit: 2021-11-21 | Discharge: 2021-11-21 | Disposition: A | Discharge status: Home / Self Care | Payer: Medicare Other | Source: Ambulatory Visit | Attending: Family Medicine | Admitting: Family Medicine

## 2021-11-21 DIAGNOSIS — M25572 Pain in left ankle and joints of left foot: Secondary | ICD-10-CM

## 2021-11-21 DIAGNOSIS — S79912A Unspecified injury of left hip, initial encounter: Secondary | ICD-10-CM

## 2022-07-03 ENCOUNTER — Other Ambulatory Visit: Payer: Self-pay | Admitting: Family Medicine

## 2022-07-03 DIAGNOSIS — Z13820 Encounter for screening for osteoporosis: Secondary | ICD-10-CM

## 2022-12-14 ENCOUNTER — Other Ambulatory Visit: Payer: Self-pay | Admitting: Family Medicine

## 2022-12-14 DIAGNOSIS — Z122 Encounter for screening for malignant neoplasm of respiratory organs: Secondary | ICD-10-CM

## 2022-12-14 DIAGNOSIS — F17210 Nicotine dependence, cigarettes, uncomplicated: Secondary | ICD-10-CM

## 2022-12-15 ENCOUNTER — Encounter: Payer: Self-pay | Admitting: Family Medicine

## 2022-12-30 ENCOUNTER — Ambulatory Visit
Admission: RE | Admit: 2022-12-30 | Discharge: 2022-12-30 | Disposition: A | Discharge status: Home / Self Care | Payer: Medicare Other | Source: Ambulatory Visit | Attending: Family Medicine | Admitting: Family Medicine

## 2022-12-30 DIAGNOSIS — Z122 Encounter for screening for malignant neoplasm of respiratory organs: Secondary | ICD-10-CM

## 2022-12-30 DIAGNOSIS — F17210 Nicotine dependence, cigarettes, uncomplicated: Secondary | ICD-10-CM

## 2023-01-21 ENCOUNTER — Ambulatory Visit
Admission: RE | Admit: 2023-01-21 | Discharge: 2023-01-21 | Disposition: A | Discharge status: Home / Self Care | Payer: No Typology Code available for payment source | Source: Ambulatory Visit | Attending: Family Medicine | Admitting: Family Medicine

## 2023-01-21 DIAGNOSIS — Z13820 Encounter for screening for osteoporosis: Secondary | ICD-10-CM

## 2023-04-22 ENCOUNTER — Ambulatory Visit
Admission: RE | Admit: 2023-04-22 | Discharge: 2023-04-22 | Disposition: A | Discharge status: Home / Self Care | Payer: Medicare Other | Source: Ambulatory Visit | Attending: Family Medicine | Admitting: Family Medicine

## 2023-04-22 ENCOUNTER — Other Ambulatory Visit: Payer: Self-pay | Admitting: Family Medicine

## 2023-04-22 DIAGNOSIS — M79671 Pain in right foot: Secondary | ICD-10-CM

## 2023-04-23 ENCOUNTER — Ambulatory Visit: Payer: Medicare Other | Admitting: Physician Assistant

## 2023-04-23 ENCOUNTER — Other Ambulatory Visit (INDEPENDENT_AMBULATORY_CARE_PROVIDER_SITE_OTHER): Payer: Medicare Other

## 2023-04-23 ENCOUNTER — Encounter: Payer: Self-pay | Admitting: Physician Assistant

## 2023-04-23 DIAGNOSIS — M25511 Pain in right shoulder: Secondary | ICD-10-CM

## 2023-04-23 DIAGNOSIS — G8929 Other chronic pain: Secondary | ICD-10-CM | POA: Diagnosis not present

## 2023-04-23 NOTE — Progress Notes (Signed)
 Office Visit Note   Patient: Debra [name]           Date of Birth: [birth year]           MRN: [id number] Visit Date: 04/23/2023              Requested by: Earvin Johnston PARAS, FNP [address] [place],  [place] [address] PCP: Earvin Johnston PARAS, FNP   Assessment & Plan: Visit Diagnoses:  1. Chronic right shoulder pain     Plan: Pleasant 68 year old woman with a history of right shoulder pain.  She has had steroid injections in the past elsewhere and is done well.  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  X-rays today do demonstrate some degenerative changes of the [facility] joint.  Her strength is intact she has a positive empty can test can raise her arm overhead but somewhat painful.  Will go forward with an injection today can follow-up if this does not help  Follow-Up Instructions: Return if symptoms worsen or fail to improve.   Orders:  Orders Placed This Encounter  Procedures   XR Shoulder Right   No orders of the defined types were placed in this encounter.     Procedures: No procedures performed   Clinical Data: No additional findings.   Subjective: Chief Complaint  Patient presents with   Right Shoulder - Pain    HPI pleasant 68 year old woman with a chief complaint of right shoulder pain.  Denies any injuries.  Has had this in the past and gotten a steroid injection elsewhere which was helpful to her.  Review of Systems  All other systems reviewed and are negative.    Objective: Vital Signs: There were no vitals taken for this visit.  Physical Exam Constitutional:      Appearance: Normal appearance.  Pulmonary:     Effort: Pulmonary effort is normal.  Skin:    General: Skin is warm and dry.  Neurological:     General: No focal deficit present.     Mental Status: She is alert and oriented to person, place, and time.  Psychiatric:        Mood and Affect: Mood normal.        Behavior: Behavior normal.     Ortho  Exam Right shoulder she has some pain with overhead elevation of her arm but negative drop arm test she is neurovascular intact no redness no erythema no deformity to the right shoulder.  Good strength with resisted abduction external rotation.  Positive empty can test pain with internal rotation behind her back no paresthesias no neck pain Specialty Comments:  No specialty comments available.  Imaging: XR Shoulder Right Result Date: 04/23/2023 Radiographs of her right shoulder demonstrate no evidence of dislocation well-placed humeral head.  She does have some degenerative changes of the [facility] joint.  No fractures noted    PMFS History: Patient Active Problem List   Diagnosis Date Noted   Demand ischemia (HCC)    Unstable angina (HCC) 05/11/2019   Chest pain    Elevated troponin    Hypertensive urgency    Anxiety 11/04/2016   Hyperlipidemia 11/04/2016   Paresthesia 11/01/2015   Essential hypertension 12/06/2014   Rhinitis, allergic 12/06/2014   Tobacco abuse 10/23/2013   Hypothyroidism 09/12/2013   Fatigue 09/11/2013   Insomnia 08/03/2012   Past Medical History:  Diagnosis Date   Allergy    Arthritis    Hypertension  Hypothyroidism    Reflux    Tobacco abuse     Family History  Problem Relation Age of Onset   Gallstones Mother    Stroke Mother 67   Hypertension Mother    Cancer Mother        lung, former smoker   Cancer Sister        breast   Thyroid  disease Brother    Colon cancer Neg Hx     Past Surgical History:  Procedure Laterality Date   LACERATION REPAIR Right 2008   arm; severe laceration requiring OR repair and PT    tubal ligation Bilateral    TUBAL LIGATION     Social History   Occupational History   Not on file  Tobacco Use   Smoking status: Every Day    Current packs/day: 0.10    Average packs/day: 0.1 packs/day for 30.0 years (3.0 ttl pk-yrs)    Types: Cigarettes   Smokeless tobacco: Never   Tobacco comments:    working on quitting -  down to 3 a day (02/25/17)  Vaping Use   Vaping status: Never Used  Substance and Sexual Activity   Alcohol use: Yes    Comment: occasional: twice a year   Drug use: No   Sexual activity: Not on file

## 2023-04-27 ENCOUNTER — Ambulatory Visit: Payer: Medicare Other | Admitting: Podiatry

## 2023-04-27 ENCOUNTER — Encounter: Payer: Self-pay | Admitting: Podiatry

## 2023-04-27 DIAGNOSIS — S92324A Nondisplaced fracture of second metatarsal bone, right foot, initial encounter for closed fracture: Secondary | ICD-10-CM | POA: Diagnosis not present

## 2023-04-27 DIAGNOSIS — R262 Difficulty in walking, not elsewhere classified: Secondary | ICD-10-CM

## 2023-04-27 DIAGNOSIS — R600 Localized edema: Secondary | ICD-10-CM

## 2023-04-27 NOTE — Progress Notes (Unsigned)
Chief Complaint  Patient presents with   Fracture    *urgent referral* new pt-acute mildly displaced and impacted fracture of the distal second metatarsal   HPI: 68 y.o. female presents today after sustaining injury to her right foot 3 weeks ago when her large dog stepped on her foot.  She tried treating this herself with rest and anti-inflammatories but it did not get any better.  She saw her PCP last week and had right foot x-rays taken which revealed a second metatarsal fracture.  She has not been immobilized to date.  She has been ambulating in regular shoe gear.  She does have pain.  She has swelling and she has difficulty walking due to the discomfort.  Denies osteoporosis and states that she takes vitamin supplements.  Past Medical History:  Diagnosis Date   Allergy    Arthritis    Hypertension    Hypothyroidism    Reflux    Tobacco abuse    Past Surgical History:  Procedure Laterality Date   LACERATION REPAIR Right 2008   arm; severe laceration requiring OR repair and PT    tubal ligation Bilateral    TUBAL LIGATION     No Known Allergies   Physical Exam: There are palpable pedal pulses to the right foot.  There is localized edema to the dorsal aspect of the right distal 50% of the second metatarsal area.  There is pain on palpation along the distal 50% of the second metatarsal shaft and neck.  There is pain with attempted range of motion of the second toe.  No significant ecchymosis is noted.  No open lesions are noted.  Epicritic sensation is intact.  Manual muscle testing deferred secondary to pain  Radiographic Exam (right foot 3 views, [place] [facility] facility, 04/22/2023):  Normal osseous mineralization.  There is a extra-articular fracture of the distal shaft/neck of the second metatarsal with minimal shortening noted.  There is some slight medial angulation of the distal fragment.  Fracture is in good position.  Assessment/Plan of Care: 1. Closed nondisplaced  fracture of second metatarsal bone of right foot, initial encounter   2. Difficulty walking   3. Localized edema     Discussed clinical and radiographic findings with patient today.  Informed the patient that she is going to need to be immobilized in a pneumatic cam walker for at least 3 weeks.  We cannot have the tendons pulling the toe up or down as this will cause retrograde movement of the metatarsal which can cause change in position of the fracture fragment.  This can slow healing as well.  The pneumatic cam walker needs to be worn at all times weightbearing and also when sleeping if she is a restless sleeper.  She can remove for bathing and gingerly walk flat-footed in and out of the shower.  She needs to ice and elevate the foot.  Continue with well-balanced diet and continuing with her vitamin supplements.  Will recheck right second metatarsal fracture in 3 weeks.  Clerance Lav, DPM, FACFAS [facility]     [address] [place], [place] [address]                Office [phone]  Fax [phone])  [phone] 

## 2023-05-18 ENCOUNTER — Ambulatory Visit: Payer: Medicare Other | Admitting: Podiatry

## 2023-05-18 ENCOUNTER — Ambulatory Visit (INDEPENDENT_AMBULATORY_CARE_PROVIDER_SITE_OTHER)

## 2023-05-18 DIAGNOSIS — M778 Other enthesopathies, not elsewhere classified: Secondary | ICD-10-CM

## 2023-05-18 DIAGNOSIS — S92324D Nondisplaced fracture of second metatarsal bone, right foot, subsequent encounter for fracture with routine healing: Secondary | ICD-10-CM | POA: Diagnosis not present

## 2023-05-18 NOTE — Progress Notes (Unsigned)
      Chief Complaint  Patient presents with   Fracture    Right foot, 2nd metatarsal bone. Doing well, wearing boot up until Sunday. No issues walking. Denies pain, denies swelling.    HPI: 67 y.o. female presents today for 4-week follow-up of a right second metatarsal fracture.  She states that she has been wearing the boot up until this past weekend.  She notes minimal to no pain at this time.  Past Medical History:  Diagnosis Date   Allergy    Arthritis    Hypertension    Hypothyroidism    Reflux    Tobacco abuse     Past Surgical History:  Procedure Laterality Date   LACERATION REPAIR Right 2008   arm; severe laceration requiring OR repair and PT    tubal ligation Bilateral    TUBAL LIGATION     No Known Allergies   Physical Exam: There are palpable pedal pulses on the right foot.  There is mild localized edema to the dorsal aspect of the foot near the distal half of the second metatarsal.  Minimal pain on palpation of the fracture area on the second metatarsal.  No pain with range of motion of the second toe.  No pain with pressure on the second metatarsal head.  Radiographic Exam (right foot, 3 weightbearing views, 05/18/2023):  Normal osseous mineralization.  The fracture is in good position with radiographic evidence of bone callus formation and adequate bridging across the fracture site.  Assessment/Plan of Care: 1. Closed nondisplaced fracture of second metatarsal bone of right foot with routine healing, subsequent encounter    Discussed clinical and radiographic findings with patient today.  Patient may return to regular shoe gear at this time.  She will avoid any high impact activities or significantly uneven terrain over the next 2 to 3 weeks.  She may resume low impact activities at this time as tolerated.  Follow-up as needed   Milly Goggins D. Bradleigh Sonnen, DPM, FACFAS [facility]     [address] [place], [place] [address]                Office [phone]  Fax [phone]

## 2023-06-14 ENCOUNTER — Encounter (HOSPITAL_COMMUNITY): Payer: Self-pay | Admitting: Emergency Medicine

## 2023-06-14 ENCOUNTER — Telehealth: Payer: Self-pay | Admitting: Physician Assistant

## 2023-06-14 ENCOUNTER — Ambulatory Visit (HOSPITAL_COMMUNITY)
Admission: EM | Admit: 2023-06-14 | Discharge: 2023-06-14 | Disposition: A | Discharge status: Home / Self Care | Attending: Family Medicine | Admitting: Family Medicine

## 2023-06-14 DIAGNOSIS — M25511 Pain in right shoulder: Secondary | ICD-10-CM | POA: Diagnosis not present

## 2023-06-14 DIAGNOSIS — G8929 Other chronic pain: Secondary | ICD-10-CM | POA: Diagnosis not present

## 2023-06-14 MED ORDER — KETOROLAC TROMETHAMINE 30 MG/ML IJ SOLN
30.0000 mg | Freq: Once | INTRAMUSCULAR | Status: AC
  Administered 2023-06-14: 30 mg via INTRAMUSCULAR

## 2023-06-14 MED ORDER — KETOROLAC TROMETHAMINE 30 MG/ML IJ SOLN
INTRAMUSCULAR | Status: AC
  Filled 2023-06-14: qty 1

## 2023-06-14 NOTE — ED Triage Notes (Signed)
 Pt c/o right shoulder pain for 3 weeks. Reports went to orthopedic and got shot in shoulder 93 weeks ago) but not cortisone because that helps for longer than whatever she got. Denies any known injury.

## 2023-06-14 NOTE — Telephone Encounter (Signed)
 Do you remember what you used?

## 2023-06-14 NOTE — ED Provider Notes (Signed)
 [facility]    CSN: [id number] Arrival date & time: 06/14/23  1202      History   Chief Complaint Chief Complaint  Patient presents with   Shoulder Pain    HPI Debra [name] is a 68 y.o. female.   HPI  She reports she is having right shoulder pain  She states she was seen by another provider and given an injection that has not provided relief. She was seen in Feb for this apt  She states pain has been 10/10 for several weeks She states she cannot lift her arm or move it posteriorly without significant pain   She is right hand dominant   Blood work results reviewed from encounter on 03/04/2023.  Reviewed BMP, thyroid panel testing results.  eGFR is 75. Patient was most recently seen at [facility] [place] to 04/23/2023 for right shoulder pain.  Note mentions completing an injection at that time but I am not sure what type.  Imaging completed during that time demonstrates degenerative changes of the [facility] joint of the right shoulder.  Past Medical History:  Diagnosis Date   Allergy    Arthritis    Hypertension    Hypothyroidism    Reflux    Tobacco abuse     Patient Active Problem List   Diagnosis Date Noted   Pain in right shoulder 04/23/2023   Demand ischemia [facility])    Unstable angina (HCC) 05/11/2019   Chest pain    Elevated troponin    Hypertensive urgency    Anxiety 11/04/2016   Hyperlipidemia 11/04/2016   Paresthesia 11/01/2015   Essential hypertension 12/06/2014   Rhinitis, allergic 12/06/2014   Tobacco abuse 10/23/2013   Hypothyroidism 09/12/2013   Fatigue 09/11/2013   Insomnia 08/03/2012    Past Surgical History:  Procedure Laterality Date   LACERATION REPAIR Right 2008   arm; severe laceration requiring OR repair and PT    tubal ligation Bilateral    TUBAL LIGATION      OB History   No obstetric history on file.      Home Medications    Prior to Admission medications   Medication Sig Start Date End Date Taking?  Authorizing Provider  acetaminophen (TYLENOL) 650 MG CR tablet Take 1,300 mg by mouth every 8 (eight) hours as needed for pain.    [provider]  aspirin 81 MG tablet Take 81 mg by mouth daily.    [provider]  atorvastatin (LIPITOR) 80 MG tablet Take 1 tablet (80 mg total) by mouth daily. 06/30/18   Lamptey, Britta Mccreedy, MD  carvedilol (COREG) 6.25 MG tablet Take 1 tablet (6.25 mg total) by mouth 2 (two) times daily. 05/30/19   Little Ishikawa, MD  hydrochlorothiazide (HYDRODIURIL) 25 MG tablet Take 1 tablet (25 mg total) by mouth daily. 05/13/19   Kroeger, Ovidio Kin., PA-C  ibuprofen (ADVIL) 800 MG tablet Take 800 mg by mouth every 6 (six) hours as needed.    [provider]  levothyroxine (SYNTHROID) 112 MCG tablet Take 1 tablet (112 mcg total) by mouth daily. 06/30/18   Lamptey, Britta Mccreedy, MD    Family History Family History  Problem Relation Age of Onset   Gallstones Mother    Stroke Mother 92   Hypertension Mother    Cancer Mother        lung, former smoker   Cancer Sister        breast   Thyroid disease Brother  Colon cancer Neg Hx     Social History Social History   Tobacco Use   Smoking status: Every Day    Current packs/day: 0.10    Average packs/day: 0.1 packs/day for 30.0 years (3.0 ttl pk-yrs)    Types: Cigarettes   Smokeless tobacco: Never   Tobacco comments:    working on quitting - down to 3 a day (02/25/17)  Vaping Use   Vaping status: Never Used  Substance Use Topics   Alcohol use: Yes    Comment: occasional: twice a year   Drug use: No     Allergies   Patient has no known allergies.   Review of Systems Review of Systems  Musculoskeletal:  Positive for arthralgias.     Physical Exam Triage Vital Signs ED Triage Vitals  Encounter Vitals Group     BP 06/14/23 1323 135/86     Systolic BP Percentile --      Diastolic BP Percentile --      Pulse Rate 06/14/23 1323 79     Resp 06/14/23 1323 17     Temp  06/14/23 1323 97.8 F (36.6 C)     Temp Source 06/14/23 1323 Oral     SpO2 06/14/23 1323 98 %     Weight --      Height --      Head Circumference --      Peak Flow --      Pain Score 06/14/23 1321 10     Pain Loc --      Pain Education --      Exclude from Growth Chart --    No data found.  Updated Vital Signs BP 135/86 (BP Location: Left Arm)   Pulse 79   Temp 97.8 F (36.6 C) (Oral)   Resp 17   SpO2 98%   Visual Acuity Right Eye Distance:   Left Eye Distance:   Bilateral Distance:    Right Eye Near:   Left Eye Near:    Bilateral Near:     Physical Exam Vitals reviewed.  Constitutional:      General: She is awake.     Appearance: Normal appearance. She is well-developed and well-groomed.  HENT:     Head: Normocephalic and atraumatic.  Pulmonary:     Effort: Pulmonary effort is normal.  Musculoskeletal:     Right shoulder: Tenderness present. Decreased range of motion. Decreased strength.     Comments: Right shoulder ROM: decreased adduction compared to left shoulder. Flexion and extension are severely limited compared to left.  She is not able to internally or externally rotate her left shoulder and has positive Empty can testing on right   Neurological:     Mental Status: She is alert.  Psychiatric:        Behavior: Behavior is cooperative.      UC Treatments / Results  Labs (all labs ordered are listed, but only abnormal results are displayed) Labs Reviewed - No data to display  EKG   Radiology No results found.  Procedures Procedures (including critical care time)  Medications Ordered in UC Medications  ketorolac (TORADOL) 30 MG/ML injection 30 mg (30 mg Intramuscular Given 06/14/23 1350)    Initial Impression / Assessment and Plan / UC Course  I have reviewed the triage vital signs and the nursing notes.  Pertinent labs & imaging results that were available during my care of the patient were reviewed by me and considered in my medical  decision making (see chart  for details).      Final Clinical Impressions(s) / UC Diagnoses   Final diagnoses:  Chronic right shoulder pain   Patient presents today with concerns for chronic right shoulder pain.  She reports that in the past she has been seen by orthopedics and has been getting steroid shots for this which typically provide lasting relief.  She was seen by orthopedics on 04/23/2023 and was provided with some injection that is not officially identified in the encounter note.  Reviewed with patient that even if she did get a steroid injection at that encounter it would still be too early to provide another 1.  At this time we will provide Toradol 30 mg injection for pain relief.  Recommend close follow-up with orthopedics for further evaluation and ongoing management.  Reviewed that she could potentially explore physical therapy or other surgical intervention as she does not want to have a complete shoulder replacement.  Reviewed importance of following up with orthopedics as they will be able to provide more significant interventions and ongoing management versus urgent care or PCP.  Patient voiced agreement and understanding of recommendations.  Follow-up as needed    Discharge Instructions      You were seen today for your chronic shoulder pain.  We have provided you an injection of Toradol 30 mg to help with pain.  This is an NSAID similar to ibuprofen or naproxen and the results typically last about a week.  During that time I recommend that you do not take any other NSAIDs and only use Tylenol for pain management.  I strongly recommend following up with your orthopedic provider to discuss potential management of your pain as the injection that you were given previously does not seem to be providing adequate relief.      ED Prescriptions   None    PDMP not reviewed this encounter.   Providence Crosby, PA-C 06/14/23 1441

## 2023-06-14 NOTE — Discharge Instructions (Signed)
 You were seen today for your chronic shoulder pain.  We have provided you an injection of Toradol 30 mg to help with pain.  This is an NSAID similar to ibuprofen or naproxen and the results typically last about a week.  During that time I recommend that you do not take any other NSAIDs and only use Tylenol for pain management.  I strongly recommend following up with your orthopedic provider to discuss potential management of your pain as the injection that you were given previously does not seem to be providing adequate relief.

## 2023-06-14 NOTE — Telephone Encounter (Signed)
 Pt called requesting a call back from PA Persons. Pt is asking for the name of the injection that was given to her I her arm. Chart say steroid injection. Was unable to see the name of steroid injection. Pt phone number is [phone].

## 2023-06-15 ENCOUNTER — Telehealth: Payer: Self-pay | Admitting: Physician Assistant

## 2023-06-15 NOTE — Telephone Encounter (Signed)
 I called and talked to the pt. Advised her on maryann's last message. She just wanted to know the medication. She isnt interested in getting a MRI at this time.

## 2023-06-15 NOTE — Telephone Encounter (Signed)
Lvm for pt to cb to advise 

## 2023-06-15 NOTE — Telephone Encounter (Signed)
 Patient called and was returning a call from radiology. CB#[phone]

## 2023-09-21 LAB — COLOGUARD

## 2023-10-28 ENCOUNTER — Other Ambulatory Visit: Payer: Self-pay | Admitting: Family Medicine

## 2023-10-28 ENCOUNTER — Encounter: Payer: Self-pay | Admitting: Family Medicine

## 2023-10-28 DIAGNOSIS — M79605 Pain in left leg: Secondary | ICD-10-CM

## 2023-10-28 DIAGNOSIS — M7989 Other specified soft tissue disorders: Secondary | ICD-10-CM

## 2023-11-26 ENCOUNTER — Other Ambulatory Visit: Payer: Self-pay | Admitting: Family Medicine

## 2023-11-26 ENCOUNTER — Ambulatory Visit
Admission: RE | Admit: 2023-11-26 | Discharge: 2023-11-26 | Disposition: A | Discharge status: Home / Self Care | Source: Ambulatory Visit | Attending: Family Medicine | Admitting: Family Medicine

## 2023-11-26 DIAGNOSIS — M79605 Pain in left leg: Secondary | ICD-10-CM

## 2023-11-26 DIAGNOSIS — M7989 Other specified soft tissue disorders: Secondary | ICD-10-CM

## 2023-11-26 DIAGNOSIS — G8929 Other chronic pain: Secondary | ICD-10-CM

## 2023-11-30 ENCOUNTER — Ambulatory Visit
Admission: RE | Admit: 2023-11-30 | Discharge: 2023-11-30 | Disposition: A | Discharge status: Home / Self Care | Source: Ambulatory Visit | Attending: Family Medicine | Admitting: Family Medicine

## 2023-11-30 DIAGNOSIS — M79605 Pain in left leg: Secondary | ICD-10-CM

## 2024-01-17 ENCOUNTER — Encounter: Payer: Self-pay | Admitting: Radiology
